# Patient Record
Sex: Male | Born: 1975 | Race: Black or African American | Hispanic: No | Marital: Married | Smoking: Never smoker
Health system: Southern US, Community
[De-identification: ages and names within clinical notes are randomized; demographics above are authoritative.]

## PROBLEM LIST (undated history)

## (undated) DIAGNOSIS — J45909 Unspecified asthma, uncomplicated: Secondary | ICD-10-CM

## (undated) DIAGNOSIS — K219 Gastro-esophageal reflux disease without esophagitis: Secondary | ICD-10-CM

## (undated) DIAGNOSIS — I10 Essential (primary) hypertension: Secondary | ICD-10-CM

## (undated) DIAGNOSIS — G4730 Sleep apnea, unspecified: Secondary | ICD-10-CM

## (undated) DIAGNOSIS — E119 Type 2 diabetes mellitus without complications: Secondary | ICD-10-CM

## (undated) HISTORY — DX: Type 2 diabetes mellitus without complications: E11.9

## (undated) HISTORY — PX: ADENOIDECTOMY: SUR15

## (undated) HISTORY — DX: Gastro-esophageal reflux disease without esophagitis: K21.9

## (undated) HISTORY — DX: Sleep apnea, unspecified: G47.30

---

## 2000-06-22 ENCOUNTER — Emergency Department (HOSPITAL_COMMUNITY): Admission: EM | Admit: 2000-06-22 | Discharge: 2000-06-22 | Payer: Self-pay | Admitting: Emergency Medicine

## 2000-06-22 ENCOUNTER — Encounter: Payer: Self-pay | Admitting: Emergency Medicine

## 2000-08-31 ENCOUNTER — Emergency Department (HOSPITAL_COMMUNITY): Admission: EM | Admit: 2000-08-31 | Discharge: 2000-08-31 | Payer: Self-pay | Admitting: Emergency Medicine

## 2000-09-01 ENCOUNTER — Emergency Department (HOSPITAL_COMMUNITY): Admission: EM | Admit: 2000-09-01 | Discharge: 2000-09-01 | Payer: Self-pay | Admitting: Emergency Medicine

## 2000-09-16 ENCOUNTER — Emergency Department (HOSPITAL_COMMUNITY): Admission: EM | Admit: 2000-09-16 | Discharge: 2000-09-16 | Payer: Self-pay | Admitting: Emergency Medicine

## 2001-02-10 ENCOUNTER — Emergency Department (HOSPITAL_COMMUNITY): Admission: EM | Admit: 2001-02-10 | Discharge: 2001-02-11 | Payer: Self-pay | Admitting: *Deleted

## 2011-02-07 ENCOUNTER — Emergency Department (HOSPITAL_BASED_OUTPATIENT_CLINIC_OR_DEPARTMENT_OTHER)
Admission: EM | Admit: 2011-02-07 | Discharge: 2011-02-07 | Disposition: A | Discharge status: Home / Self Care | Payer: Self-pay | Attending: Emergency Medicine | Admitting: Emergency Medicine

## 2011-02-07 DIAGNOSIS — I10 Essential (primary) hypertension: Secondary | ICD-10-CM | POA: Insufficient documentation

## 2011-02-07 DIAGNOSIS — G4730 Sleep apnea, unspecified: Secondary | ICD-10-CM | POA: Insufficient documentation

## 2011-02-07 DIAGNOSIS — J45909 Unspecified asthma, uncomplicated: Secondary | ICD-10-CM | POA: Insufficient documentation

## 2011-02-07 DIAGNOSIS — J329 Chronic sinusitis, unspecified: Secondary | ICD-10-CM | POA: Insufficient documentation

## 2015-05-29 ENCOUNTER — Emergency Department (HOSPITAL_BASED_OUTPATIENT_CLINIC_OR_DEPARTMENT_OTHER)
Admission: EM | Admit: 2015-05-29 | Discharge: 2015-05-29 | Disposition: A | Discharge status: Home / Self Care | Payer: Self-pay | Attending: Emergency Medicine | Admitting: Emergency Medicine

## 2015-05-29 ENCOUNTER — Encounter (HOSPITAL_BASED_OUTPATIENT_CLINIC_OR_DEPARTMENT_OTHER): Payer: Self-pay | Admitting: Emergency Medicine

## 2015-05-29 DIAGNOSIS — J45909 Unspecified asthma, uncomplicated: Secondary | ICD-10-CM | POA: Insufficient documentation

## 2015-05-29 DIAGNOSIS — R21 Rash and other nonspecific skin eruption: Secondary | ICD-10-CM | POA: Insufficient documentation

## 2015-05-29 DIAGNOSIS — I10 Essential (primary) hypertension: Secondary | ICD-10-CM | POA: Insufficient documentation

## 2015-05-29 HISTORY — DX: Unspecified asthma, uncomplicated: J45.909

## 2015-05-29 HISTORY — DX: Essential (primary) hypertension: I10

## 2015-05-29 MED ORDER — METHYLPREDNISOLONE 4 MG PO TBPK: ORAL_TABLET | ORAL | Status: DC

## 2015-05-29 MED ORDER — METHYLPREDNISOLONE SODIUM SUCC 125 MG IJ SOLR
125.0000 mg | Freq: Once | INTRAMUSCULAR | Status: AC
  Administered 2015-05-29: 125 mg via INTRAMUSCULAR
  Filled 2015-05-29: qty 2

## 2015-05-29 MED ORDER — HYDROXYZINE HCL 25 MG PO TABS
25.0000 mg | ORAL_TABLET | Freq: Once | ORAL | Status: AC
  Administered 2015-05-29: 25 mg via ORAL
  Filled 2015-05-29: qty 1

## 2015-05-29 MED ORDER — HYDROXYZINE HCL 25 MG PO TABS: 25.0000 mg | ORAL_TABLET | Freq: Four times a day (QID) | ORAL | Status: DC | PRN

## 2015-05-29 NOTE — ED Provider Notes (Signed)
CSN: [id number]     Arrival date & time 05/29/15  0027 History   First MD Initiated Contact with Patient 05/29/15 0138     Chief Complaint  Patient presents with  . Rash     (Consider location/radiation/quality/duration/timing/severity/associated sxs/prior Treatment) HPI  This is a 39 year old male with a rash that has been present for a week and a half. The onset was gradual. The rash began on his buttocks and is now generalized. It is characterized by a mixture of small vesicular lesions as well as thickened plaques. It is severely pruritic. He has not taken anything for his symptoms. He is not aware of being exposed to anything that may of caused a rash. He denies exposure to noise in his plans. He denies any respiratory changes. He denies any fever or other symptoms of a systemic illness. He denies nausea, vomiting and diarrhea. He has chronic patchy hyperpigmentation of his back and left upper abdomen as well as lower legs which has not acutely changed.  Past Medical History  Diagnosis Date  . Hypertension   . Asthma    Past Surgical History  Procedure Laterality Date  . Adenoidectomy     History reviewed. No pertinent family history. Social History  Substance Use Topics  . Smoking status: Never Smoker   . Smokeless tobacco: None  . Alcohol Use: No    Review of Systems  All other systems reviewed and are negative.   Allergies  Review of patient's allergies indicates no known allergies.  Home Medications   Prior to Admission medications   Not on File   BP 137/79 mmHg  Pulse 88  Temp(Src) 98.6 F (37 C) (Oral)  Resp 18  Ht  (1.905 m)  Wt 340 lb (154.223 kg)  BMI 42.50 kg/m2  SpO2 97%   Physical Exam  General: Well-developed, well-nourished male in no acute distress; appearance consistent with age of record HENT: normocephalic; atraumatic Eyes: pupils equal, round and reactive to light; extraocular muscles intact Neck: supple Heart: regular rate and  rhythm Lungs: clear to auscultation bilaterally Abdomen: soft; nondistended; nontender; reducible umbilical hernia Extremities: No deformity; full range of motion; pulses normal Neurologic: Awake, alert and oriented; motor function intact in all extremities and symmetric; no facial droop Skin: Warm and dry; generalized rash with both vesicular and plaque-like components:  Right Buttock   Chest and Abdomen (large hyperpigmented lesion is not acute):    Face and Neck:   Psychiatric: Normal mood and affect    ED Course  Procedures (including critical care time)   MDM  We'll treat with cortical steroids and anti-histamine and refer to dermatology.   Paula Libra, MD 05/29/15 [phone]

## 2015-05-29 NOTE — ED Notes (Signed)
Rash on body and arms x 1 week  Increased itching  Denies pain

## 2015-05-29 NOTE — ED Notes (Signed)
Pt in c/o blistering rash to trunk and extremities x 1 week. Denies working outside around plants.

## 2015-07-19 ENCOUNTER — Encounter (HOSPITAL_COMMUNITY): Payer: Self-pay | Admitting: Emergency Medicine

## 2015-07-19 ENCOUNTER — Emergency Department (HOSPITAL_COMMUNITY): Payer: Self-pay

## 2015-07-19 ENCOUNTER — Emergency Department (HOSPITAL_COMMUNITY)
Admission: EM | Admit: 2015-07-19 | Discharge: 2015-07-19 | Disposition: A | Discharge status: Home / Self Care | Payer: Self-pay | Attending: Emergency Medicine | Admitting: Emergency Medicine

## 2015-07-19 DIAGNOSIS — R079 Chest pain, unspecified: Secondary | ICD-10-CM | POA: Insufficient documentation

## 2015-07-19 DIAGNOSIS — I10 Essential (primary) hypertension: Secondary | ICD-10-CM | POA: Insufficient documentation

## 2015-07-19 DIAGNOSIS — J45901 Unspecified asthma with (acute) exacerbation: Secondary | ICD-10-CM | POA: Insufficient documentation

## 2015-07-19 LAB — I-STAT TROPONIN, ED
TROPONIN I, POC: 0 ng/mL (ref 0.00–0.08)
Troponin i, poc: 0 ng/mL (ref 0.00–0.08)

## 2015-07-19 LAB — BASIC METABOLIC PANEL
Anion gap: 5 (ref 5–15)
BUN: 8 mg/dL (ref 6–20)
CALCIUM: 8.7 mg/dL — AB (ref 8.9–10.3)
CO2: 32 mmol/L (ref 22–32)
CREATININE: 1.06 mg/dL (ref 0.61–1.24)
Chloride: 102 mmol/L (ref 101–111)
GFR calc Af Amer: >60 mL/min (ref >60)
GFR calc non Af Amer: >60 mL/min (ref >60)
GLUCOSE: 109 mg/dL — AB (ref 65–99)
Potassium: 3.5 mmol/L (ref 3.5–5.1)
Sodium: 139 mmol/L (ref 135–145)

## 2015-07-19 LAB — CBC
HCT: 43.2 % (ref 39.0–52.0)
HEMOGLOBIN: 14.2 g/dL (ref 13.0–17.0)
MCH: 31.1 pg (ref 26.0–34.0)
MCHC: 32.9 g/dL (ref 30.0–36.0)
MCV: 94.7 fL (ref 78.0–100.0)
PLATELETS: 175 10*3/uL (ref 150–400)
RBC: 4.56 MIL/uL (ref 4.22–5.81)
RDW: 13.8 % (ref 11.5–15.5)
WBC: 10.8 10*3/uL — ABNORMAL HIGH (ref 4.0–10.5)

## 2015-07-19 MED ORDER — IBUPROFEN 800 MG PO TABS: 800.0000 mg | ORAL_TABLET | Freq: Three times a day (TID) | ORAL | Status: DC

## 2015-07-19 MED ORDER — CLOTRIMAZOLE 1 % EX CREA: TOPICAL_CREAM | CUTANEOUS | Status: DC

## 2015-07-19 MED ORDER — GI COCKTAIL ~~LOC~~
30.0000 mL | Freq: Once | ORAL | Status: AC
Start: 2015-07-19 — End: 2015-07-19
  Administered 2015-07-19: 30 mL via ORAL
  Filled 2015-07-19: qty 30

## 2015-07-19 MED ORDER — PANTOPRAZOLE SODIUM 20 MG PO TBEC: 20.0000 mg | DELAYED_RELEASE_TABLET | Freq: Every day | ORAL | Status: DC

## 2015-07-19 MED ORDER — IOHEXOL 350 MG/ML SOLN
100.0000 mL | Freq: Once | INTRAVENOUS | Status: AC | PRN
  Administered 2015-07-19: 100 mL via INTRAVENOUS

## 2015-07-19 MED ORDER — CLOTRIMAZOLE 1 % EX CREA
TOPICAL_CREAM | Freq: Two times a day (BID) | CUTANEOUS | Status: DC
  Administered 2015-07-19: 20:00:00 via TOPICAL
  Filled 2015-07-19: qty 15

## 2015-07-19 NOTE — Discharge Instructions (Signed)
Take protonix daily for indigestion. Use lotrimin as directed until symptoms resolve. Refer to attached documents for more information. Return to the ED with worsening or concerning symptoms.  Chest Wall Pain Chest wall pain is pain in or around the bones and muscles of your chest. Sometimes, an injury causes this pain. Sometimes, the cause may not be known. This pain may take several weeks or longer to get better. HOME CARE INSTRUCTIONS  Pay attention to any changes in your symptoms. Take these actions to help with your pain:   Rest as told by your health care provider.   Avoid activities that cause pain. These include any activities that use your chest muscles or your abdominal and side muscles to lift heavy items.   If directed, apply ice to the painful area:  Put ice in a plastic bag.  Place a towel between your skin and the bag.  Leave the ice on for 20 minutes, 2-3 times per day.  Take over-the-counter and prescription medicines only as told by your health care provider.  Do not use tobacco products, including cigarettes, chewing tobacco, and e-cigarettes. If you need help quitting, ask your health care provider.  Keep all follow-up visits as told by your health care provider. This is important. SEEK MEDICAL CARE IF:  You have a fever.  Your chest pain becomes worse.  You have new symptoms. SEEK IMMEDIATE MEDICAL CARE IF:  You have nausea or vomiting.  You feel sweaty or light-headed.  You have a cough with phlegm (sputum) or you cough up blood.  You develop shortness of breath.   This information is not intended to replace advice given to you by your health care provider. Make sure you discuss any questions you have with your health care provider.   Document Released: 09/29/2005 Document Revised: 06/20/2015 Document Reviewed: 12/25/2014 Elsevier Interactive Patient Education [employer]

## 2015-07-19 NOTE — Progress Notes (Signed)
Buddy Duty [facility] Eligibility [facility] [phone]  Spoke to patient regarding primary care resources and the [facility] orange card. Patient sts he is currently on workers comp for current medical issues. Orange card application provided and explained, resource guide and my contact information also provided for any future questions or concerns. No other [facility] needs identified at this time.

## 2015-07-19 NOTE — ED Notes (Signed)
Pt came in per EMS c/o chest pain that started at work this morning. 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 Pt denies weakness, N/V/. Pt says that pain lasted for 45 minutes to an hour and then went away. Pt denies chest pain at this time or SOB.

## 2015-07-19 NOTE — ED Notes (Signed)
Pt placed in a gown and hooked up to the monitor with a 5 lead, BP cuff and pulse ox 

## 2015-07-19 NOTE — ED Provider Notes (Signed)
CSN: [id number]     Arrival date & time 07/19/15  1231 History   First MD Initiated Contact with Patient 07/19/15 1249     Chief Complaint  Patient presents with  . Chest Pain     (Consider location/radiation/quality/duration/timing/severity/associated sxs/prior Treatment) HPI Comments: Patient is a 39 year old male with a past medical history of hypertension and asthma who presents with chest pain that started at 9:30am today while he was at work. 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 He reports the severe pain lasted about 3 minutes and digressed to moderate pain and fully subsided after 30 minutes. He reports associated SOB. No aggravating/alleviating factors. Patient reports feeling like this previously and reports "it was just gas".   Past Medical History  Diagnosis Date  . Hypertension   . Asthma    Past Surgical History  Procedure Laterality Date  . Adenoidectomy     History reviewed. No pertinent family history. Social History  Substance Use Topics  . Smoking status: Never Smoker   . Smokeless tobacco: None  . Alcohol Use: No    Review of Systems  Respiratory: Positive for shortness of breath.   Cardiovascular: Positive for chest pain.  All other systems reviewed and are negative.     Allergies  Review of patient's allergies indicates no known allergies.  Home Medications   Prior to Admission medications   Medication Sig Start Date End Date Taking? Authorizing Provider  hydrOXYzine (ATARAX/VISTARIL) 25 MG tablet Take 1-2 tablets (25-50 mg total) by mouth every 6 (six) hours as needed for itching (may cause drowsiness). 05/29/15   John Molpus, MD  methylPREDNISolone (MEDROL DOSEPAK) 4 MG TBPK tablet Take tapering course per package instructions. 05/29/15   John Molpus, MD   BP 111/56 mmHg  Pulse 78  Temp(Src) 97.5 F (36.4 C) (Oral)  Resp 13  SpO2  96% Physical Exam  Constitutional: He is oriented to person, place, and time. He appears well-developed and well-nourished. No distress.  HENT:  Head: Normocephalic and atraumatic.  Eyes: Conjunctivae and EOM are normal.  Neck: Normal range of motion.  Cardiovascular: Normal rate and regular rhythm.  Exam reveals no gallop and no friction rub.   No murmur heard. Pulmonary/Chest: Effort normal and breath sounds normal. He has no wheezes. He has no rales. He exhibits no tenderness.  Abdominal: Soft. He exhibits no distension. There is no tenderness. There is no rebound.  Musculoskeletal: Normal range of motion.  Neurological: He is alert and oriented to person, place, and time. Coordination normal.  Speech is goal-oriented. Moves limbs without ataxia.   Skin: Skin is warm and dry.  Psychiatric: He has a normal mood and affect. His behavior is normal.  Nursing note and vitals reviewed.   ED Course  Procedures (including critical care time) Labs Review Labs Reviewed  BASIC METABOLIC PANEL - Abnormal; Notable for the following:    Glucose, Bld 109 (*)    Calcium 8.7 (*)    All other components within normal limits  CBC - Abnormal; Notable for the following:    WBC 10.8 (*)    All other components within normal limits  I-STAT TROPOININ, ED  Rosezena Sensor, ED    Imaging Review Dg Chest 2 View  07/19/2015   CLINICAL DATA:  Midchest pain.  EXAM: CHEST  2 VIEW  COMPARISON:  None.  FINDINGS: The  heart size and mediastinal contours are within normal limits. Both lungs are clear. The visualized skeletal structures are unremarkable.  IMPRESSION: No active cardiopulmonary disease.   Electronically Signed   By: Elige Ko   On: 07/19/2015 13:25   I have personally reviewed and evaluated these images and lab results as part of my medical decision-making.   EKG Interpretation   Date/Time:  Thursday July 19 2015 12:41:21 EDT Ventricular Rate:  89 PR Interval:  164 QRS Duration:  106 QT Interval:  351 QTC Calculation: 427 R Axis:   33 Text Interpretation:  Sinus rhythm ST elevation suggests acute  pericarditis Confirmed by ZACKOWSKI  MD, SCOTT ([id number]) on 07/19/2015  12:45:41 PM      MDM   Final diagnoses:  Chest pain    1:12 PM Patient's labs and chest xray pending. Vitals stable and patient afebrile.   2:11 PM Patient's HEART score 3. Patient will have delta troponin here and be discharged if normal. Patient will have GI cocktail here.   Patient signed out to Dr. Gwendolyn Grant.   Emilia Beck, PA-C 07/22/15 1610  Vanetta Mulders, MD 07/25/15 [phone]

## 2015-07-19 NOTE — ED Notes (Signed)
Pt requested sprite, graham crackers and peanut butter pt given the same ok per RN Denny Peon

## 2016-04-02 IMAGING — DX DG CHEST 2V
2 series · 2 of 2 positions shown · non-contrast
Comparison: None.

CLINICAL DATA: Midchest pain.

EXAM:
CHEST  2 VIEW

[w chest pa]
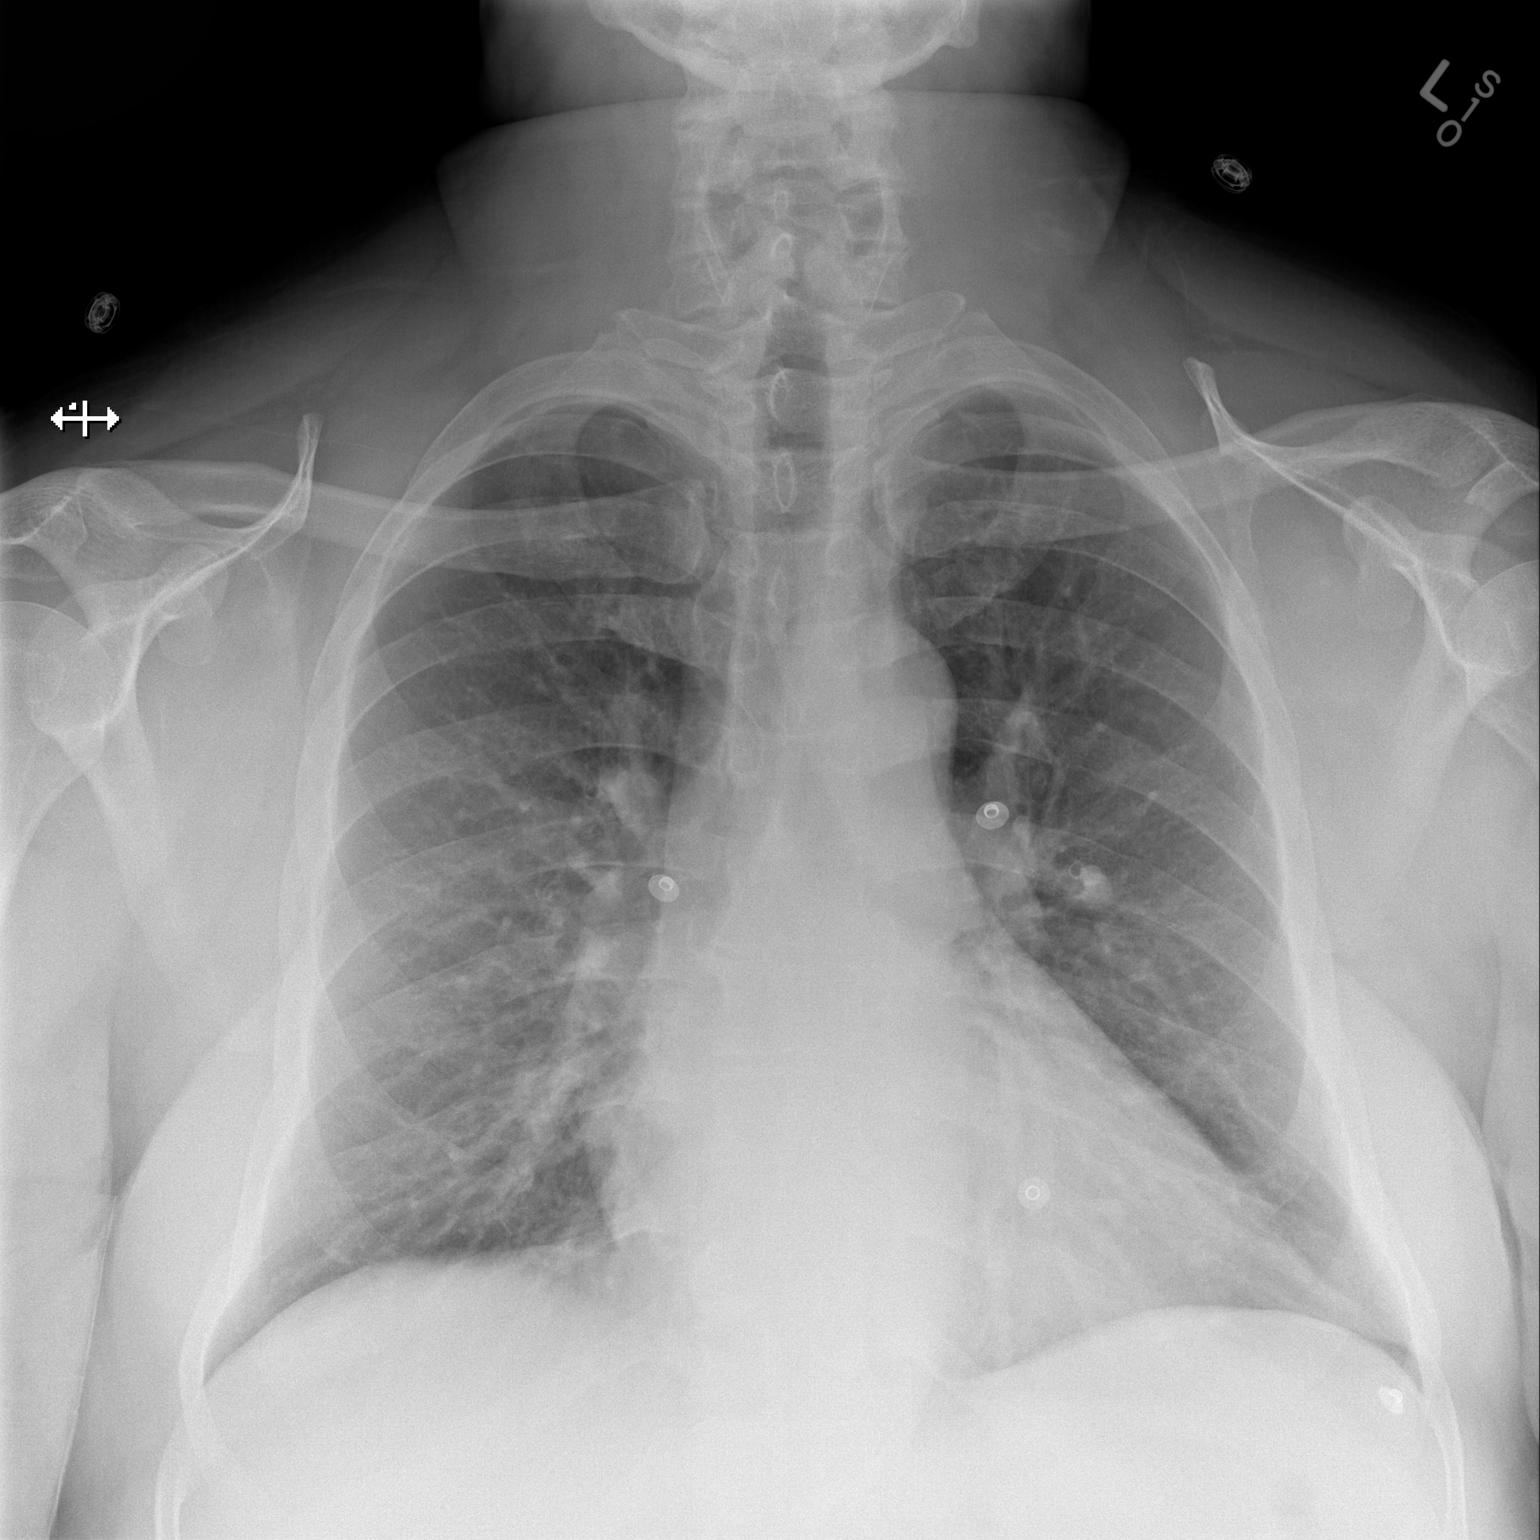

[w chest lat]
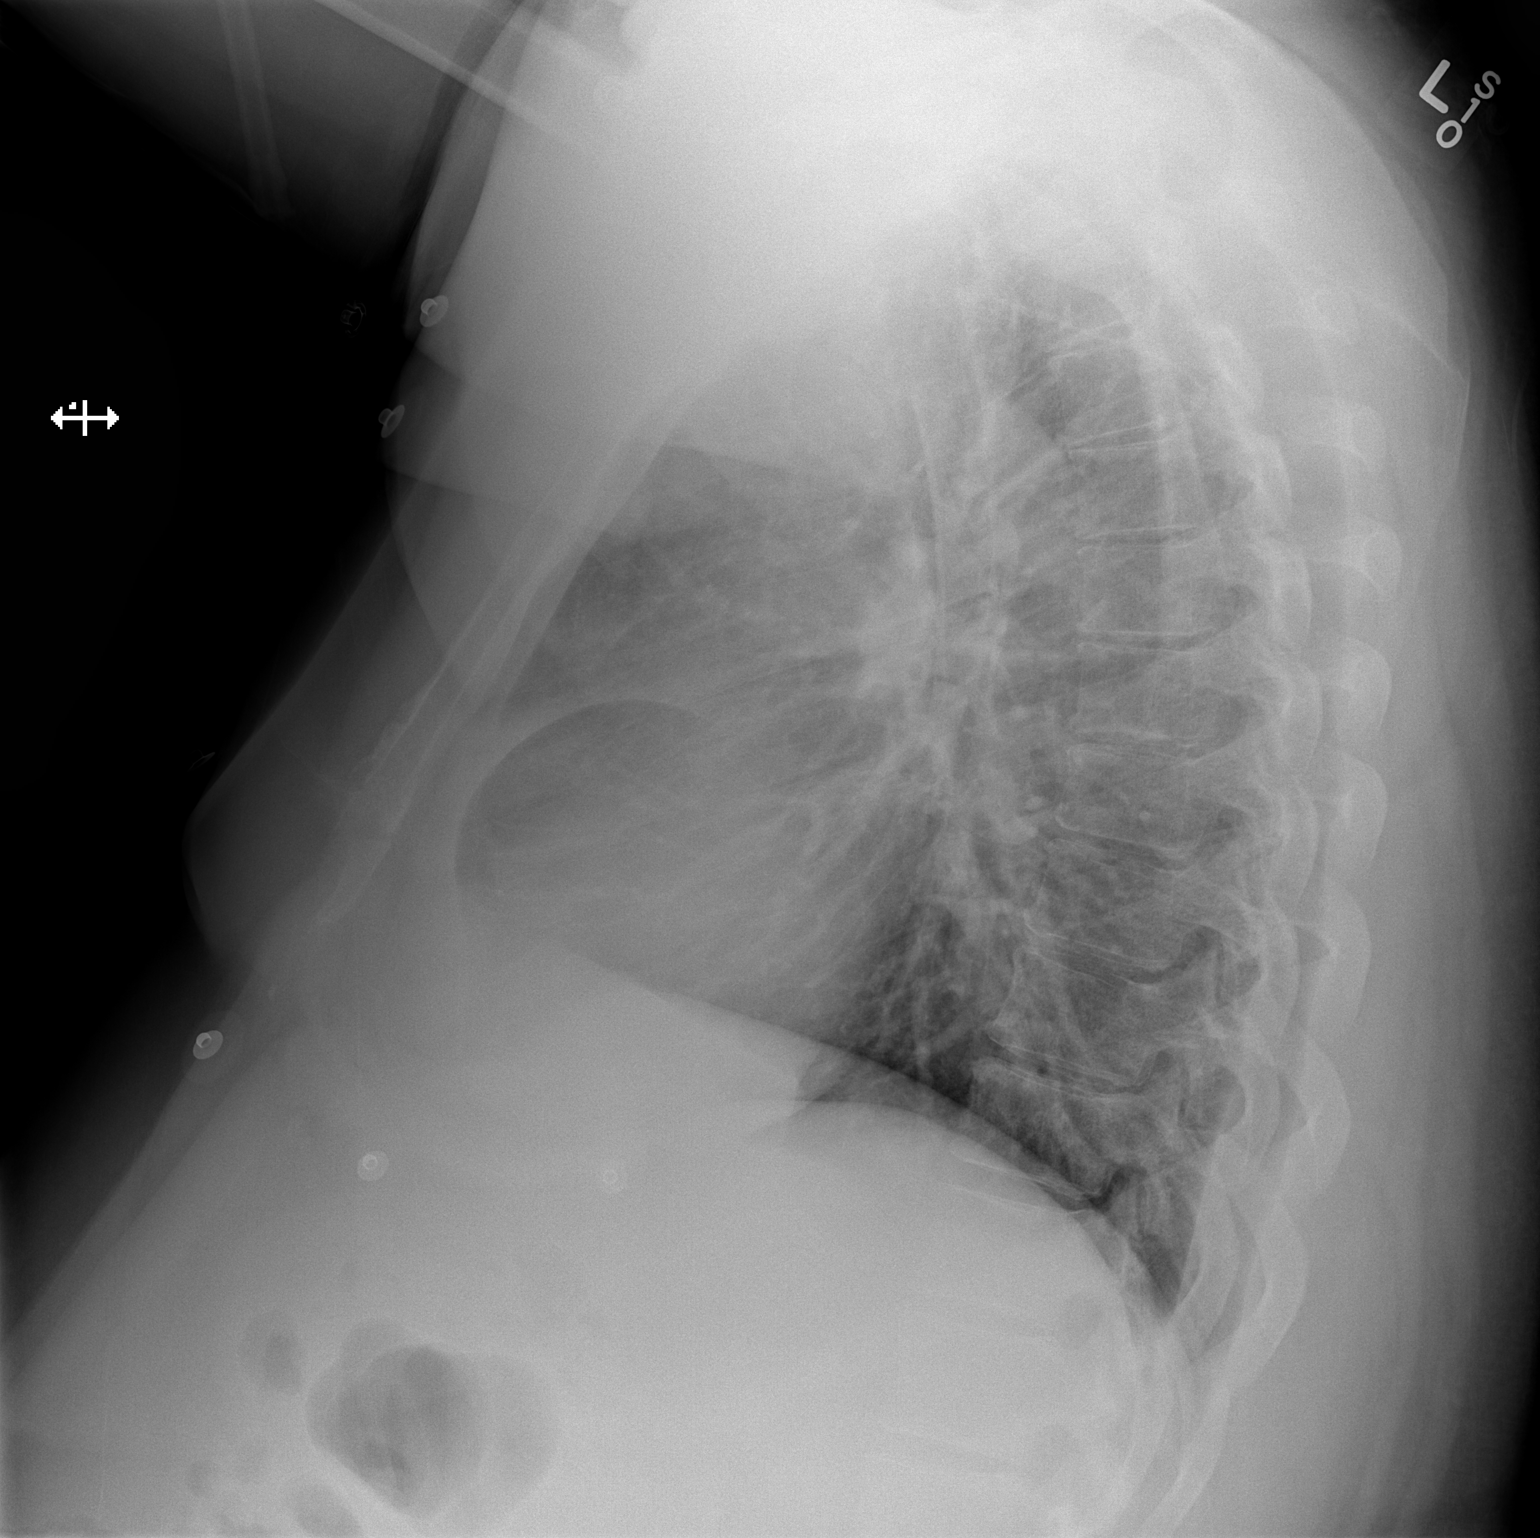

[2 of 2 positions shown; findings below may reference images not displayed]

FINDINGS: The heart size and mediastinal contours are within normal limits.
Both lungs are clear. The visualized skeletal structures are
unremarkable.
IMPRESSION: No active cardiopulmonary disease.

## 2017-11-09 ENCOUNTER — Other Ambulatory Visit: Payer: Self-pay

## 2017-11-09 ENCOUNTER — Encounter (HOSPITAL_COMMUNITY): Payer: Self-pay | Admitting: Emergency Medicine

## 2017-11-09 ENCOUNTER — Emergency Department (HOSPITAL_COMMUNITY): Payer: Self-pay

## 2017-11-09 DIAGNOSIS — J45909 Unspecified asthma, uncomplicated: Secondary | ICD-10-CM | POA: Insufficient documentation

## 2017-11-09 DIAGNOSIS — I10 Essential (primary) hypertension: Secondary | ICD-10-CM | POA: Insufficient documentation

## 2017-11-09 DIAGNOSIS — Z79899 Other long term (current) drug therapy: Secondary | ICD-10-CM | POA: Insufficient documentation

## 2017-11-09 DIAGNOSIS — K219 Gastro-esophageal reflux disease without esophagitis: Secondary | ICD-10-CM | POA: Insufficient documentation

## 2017-11-09 LAB — BASIC METABOLIC PANEL
Anion gap: 11 (ref 5–15)
BUN: 12 mg/dL (ref 6–20)
CHLORIDE: 103 mmol/L (ref 101–111)
CO2: 27 mmol/L (ref 22–32)
CREATININE: 1.06 mg/dL (ref 0.61–1.24)
Calcium: 9.1 mg/dL (ref 8.9–10.3)
GFR calc non Af Amer: >60 mL/min (ref >60)
Glucose, Bld: 144 mg/dL — ABNORMAL HIGH (ref 65–99)
Potassium: 3.6 mmol/L (ref 3.5–5.1)
Sodium: 141 mmol/L (ref 135–145)

## 2017-11-09 LAB — CBC
HCT: 45.2 % (ref 39.0–52.0)
Hemoglobin: 15.1 g/dL (ref 13.0–17.0)
MCH: 31.3 pg (ref 26.0–34.0)
MCHC: 33.4 g/dL (ref 30.0–36.0)
MCV: 93.8 fL (ref 78.0–100.0)
PLATELETS: 187 10*3/uL (ref 150–400)
RBC: 4.82 MIL/uL (ref 4.22–5.81)
RDW: 13.3 % (ref 11.5–15.5)
WBC: 10.6 10*3/uL — AB (ref 4.0–10.5)

## 2017-11-09 LAB — I-STAT TROPONIN, ED: TROPONIN I, POC: 0 ng/mL (ref 0.00–0.08)

## 2017-11-09 NOTE — ED Triage Notes (Signed)
Pt c/o intermittent chest pain, left arm tingling that started today. Pt reports that he has been belching a lot tonight as well. Denies shortness of breath/nausea/vomiting. Denies chest pain at this time.

## 2017-11-10 ENCOUNTER — Emergency Department (HOSPITAL_COMMUNITY)
Admission: EM | Admit: 2017-11-10 | Discharge: 2017-11-10 | Disposition: A | Discharge status: Home / Self Care | Payer: Self-pay | Attending: Emergency Medicine | Admitting: Emergency Medicine

## 2017-11-10 ENCOUNTER — Other Ambulatory Visit: Payer: Self-pay

## 2017-11-10 DIAGNOSIS — R0789 Other chest pain: Secondary | ICD-10-CM

## 2017-11-10 DIAGNOSIS — K219 Gastro-esophageal reflux disease without esophagitis: Secondary | ICD-10-CM

## 2017-11-10 LAB — I-STAT TROPONIN, ED: Troponin i, poc: 0.02 ng/mL (ref 0.00–0.08)

## 2017-11-10 MED ORDER — GI COCKTAIL ~~LOC~~
30.0000 mL | Freq: Once | ORAL | Status: AC
  Administered 2017-11-10: 30 mL via ORAL
  Filled 2017-11-10: qty 30

## 2017-11-10 MED ORDER — PANTOPRAZOLE SODIUM 40 MG PO TBEC: 40.0000 mg | DELAYED_RELEASE_TABLET | Freq: Every day | ORAL | 0 refills | Status: DC

## 2017-11-10 NOTE — ED Notes (Signed)
ED Provider at bedside. 

## 2017-11-10 NOTE — Discharge Instructions (Addendum)
You may use over-the-counter Maalox which will help with heartburn, indigestion and gas.   Please avoid NSAIDs such as aspirin (Goody powders), ibuprofen (Motrin, Advil), naproxen (Aleve) as these may worsen your symptoms.  Tylenol 1000 mg every 6 hours is safe to take as long as you have no history of liver problems (heavy alcohol use, cirrhosis, hepatitis).  Please avoid spicy, acidic (citrus fruits, tomato based sauces, salsa), greasy, fatty foods.  Please avoid caffeine and alcohol.      To find a primary care or specialty doctor please call [phone] or [phone] to access "[place] Find a Doctor Service."  You may also go on the [facility] website at [email or web address]  There are also multiple [facility], [facility], [facility] and [facility] practices throughout the [place] that are frequently accepting new patients. You may find a clinic that is close to your home and contact them.  [facility] -  [address] [place] [id number] [phone]   [facility] -  [address] [place] [id number] [phone]   [facility] [phone]- [phone]  [place] [phone]

## 2017-11-10 NOTE — ED Provider Notes (Signed)
TIME SEEN: 5:44 AM  CHIEF COMPLAINT: Chest pain  HPI: Patient is a 42 year old male with history of obesity, hypertension, diabetes who presents emergency department with chest pain.  Chest pain started around 9 PM 1 hour after eating a cheeseburger macaroni.  Describes the pain as a sharp, pressure-like pain that is better after belching.  Has had similar symptoms with gas in the past and reports a GI cocktail has helped him significantly.  No significant pain currently.  No shortness of breath, nausea, vomiting, diaphoresis or dizziness.  No fever or cough.  No lower extremity swelling or pain.  Pain is not pleuritic or exertional.  ROS: See HPI Constitutional: no fever  Eyes: no drainage  ENT: no runny nose   Cardiovascular:   chest pain  Resp: no SOB  GI: no vomiting GU: no dysuria Integumentary: no rash  Allergy: no hives  Musculoskeletal: no leg swelling  Neurological: no slurred speech ROS otherwise negative  PAST MEDICAL HISTORY/PAST SURGICAL HISTORY:  Past Medical History:  Diagnosis Date  . Asthma   . Hypertension     MEDICATIONS:  Prior to Admission medications   Medication Sig Start Date End Date Taking? Authorizing Provider  Armodafinil 250 MG tablet Take 250 mg by mouth. 03/14/15   [provider]  clotrimazole (LOTRIMIN) 1 % cream Apply to affected area daily 07/19/15   Emilia Beck, PA-C  hydrochlorothiazide (HYDRODIURIL) 25 MG tablet Take 25 mg by mouth daily.    [provider]  ibuprofen (ADVIL,MOTRIN) 800 MG tablet Take 1 tablet (800 mg total) by mouth 3 (three) times daily. 07/19/15   Elwin Mocha, MD  pantoprazole (PROTONIX) 20 MG tablet Take 1 tablet (20 mg total) by mouth daily. 07/19/15   Emilia Beck, PA-C    ALLERGIES:  No Known Allergies  SOCIAL HISTORY:  Social History   Tobacco Use  . Smoking status: Never Smoker  . Smokeless tobacco: Never Used  Substance Use Topics  . Alcohol use: No    FAMILY HISTORY: No  family history on file.  EXAM: BP 117/70   Pulse 88   Temp 99 F (37.2 C) (Oral)   Resp 16   Ht 6\' 3"  (1.905 m)   SpO2 96%   BMI 42.50 kg/m  CONSTITUTIONAL: Alert and oriented and responds appropriately to questions. Well-appearing; well-nourished.  Obese.  No distress.  HEAD: Normocephalic EYES: Conjunctivae clear, pupils appear equal, EOMI ENT: normal nose; moist mucous membranes NECK: Supple, no meningismus, no nuchal rigidity, no LAD  CARD: RRR; S1 and S2 appreciated; no murmurs, no clicks, no rubs, no gallops CHEST:  Chest wall is tender to palpation over the center of the anterior chest wall which reproduces some of his pain.  No crepitus, ecchymosis, erythema, warmth, rash or other lesions present.   RESP: Normal chest excursion without splinting or tachypnea; breath sounds clear and equal bilaterally; no wheezes, no rhonchi, no rales, no hypoxia or respiratory distress, speaking full sentences ABD/GI: Normal bowel sounds; non-distended; soft, non-tender, no rebound, no guarding, no peritoneal signs, no hepatosplenomegaly BACK:  The back appears normal and is non-tender to palpation, there is no CVA tenderness EXT: Normal ROM in all joints; non-tender to palpation; no edema; normal capillary refill; no cyanosis, no calf tenderness or swelling    SKIN: Normal color for age and race; warm; no rash NEURO: Moves all extremities equally PSYCH: The patient's mood and manner are appropriate. Grooming and personal hygiene are appropriate.  MEDICAL DECISION MAKING: Patient here with atypical  chest pain.  First troponin negative.  He does have some risk factors for ACS therefore will obtain second troponin suspect this is GI in nature versus musculoskeletal pain.  Doubt PE or dissection.  Labs otherwise unremarkable.  Abdominal exam benign.  Chest x-ray clear.  Will give GI cocktail given patient reports significant help with similar symptoms in the past with this medication.  ED PROGRESS:  Patient reports significant relief after GI cocktail.  Second troponin negative.  I feel he is safe to be discharged and follow-up with primary care as an outpatient.  Will discharge with prescription of Protonix.  Recommended over-the-counter Maalox as needed.  Discussed return precautions.  Recommended diet changes.  Patient and wife comfortable with this plan.  At this time, I do not feel there is any life-threatening condition present. I have reviewed and discussed all results (EKG, imaging, lab, urine as appropriate) and exam findings with patient/family. I have reviewed nursing notes and appropriate previous records.  I feel the patient is safe to be discharged home without further emergent workup and can continue workup as an outpatient as needed. Discussed usual and customary return precautions. Patient/family verbalize understanding and are comfortable with this plan.  Outpatient follow-up has been provided if needed. All questions have been answered.      EKG Interpretation  Date/Time:  Monday November 09 2017 23:13:42 EST Ventricular Rate:  95 PR Interval:  168 QRS Duration: 96 QT Interval:  350 QTC Calculation: 439 R Axis:   30 Text Interpretation:  Normal sinus rhythm Normal ECG No significant change since last tracing in 2002 Confirmed by Oneta Sigman, Baxter HireKristen [id number]) on 11/10/2017 5:44:30 AM         [name], [name], DO 11/10/17 [id number]

## 2019-07-05 ENCOUNTER — Other Ambulatory Visit: Payer: Self-pay

## 2019-07-05 ENCOUNTER — Emergency Department (HOSPITAL_BASED_OUTPATIENT_CLINIC_OR_DEPARTMENT_OTHER): Payer: Self-pay

## 2019-07-05 ENCOUNTER — Encounter (HOSPITAL_BASED_OUTPATIENT_CLINIC_OR_DEPARTMENT_OTHER): Payer: Self-pay | Admitting: *Deleted

## 2019-07-05 ENCOUNTER — Emergency Department (HOSPITAL_BASED_OUTPATIENT_CLINIC_OR_DEPARTMENT_OTHER)
Admission: EM | Admit: 2019-07-05 | Discharge: 2019-07-06 | Disposition: A | Discharge status: Home / Self Care | Payer: Self-pay | Attending: Emergency Medicine | Admitting: Emergency Medicine

## 2019-07-05 DIAGNOSIS — I10 Essential (primary) hypertension: Secondary | ICD-10-CM | POA: Insufficient documentation

## 2019-07-05 DIAGNOSIS — Z79899 Other long term (current) drug therapy: Secondary | ICD-10-CM | POA: Insufficient documentation

## 2019-07-05 DIAGNOSIS — J45909 Unspecified asthma, uncomplicated: Secondary | ICD-10-CM | POA: Insufficient documentation

## 2019-07-05 DIAGNOSIS — R072 Precordial pain: Secondary | ICD-10-CM | POA: Insufficient documentation

## 2019-07-05 DIAGNOSIS — Z7984 Long term (current) use of oral hypoglycemic drugs: Secondary | ICD-10-CM | POA: Insufficient documentation

## 2019-07-05 LAB — CBC
HCT: 48 % (ref 39.0–52.0)
Hemoglobin: 15.1 g/dL (ref 13.0–17.0)
MCH: 30.6 pg (ref 26.0–34.0)
MCHC: 31.5 g/dL (ref 30.0–36.0)
MCV: 97.4 fL (ref 80.0–100.0)
Platelets: 208 10*3/uL (ref 150–400)
RBC: 4.93 MIL/uL (ref 4.22–5.81)
RDW: 13.2 % (ref 11.5–15.5)
WBC: 11 10*3/uL — ABNORMAL HIGH (ref 4.0–10.5)
nRBC: 0 % (ref 0.0–0.2)

## 2019-07-05 LAB — URINALYSIS, ROUTINE W REFLEX MICROSCOPIC
Bilirubin Urine: NEGATIVE
Glucose, UA: NEGATIVE mg/dL
Hgb urine dipstick: NEGATIVE
Ketones, ur: NEGATIVE mg/dL
Leukocytes,Ua: NEGATIVE
Nitrite: NEGATIVE
Protein, ur: NEGATIVE mg/dL
Specific Gravity, Urine: 1.025 (ref 1.005–1.030)
pH: 6 (ref 5.0–8.0)

## 2019-07-05 LAB — TROPONIN I (HIGH SENSITIVITY): Troponin I (High Sensitivity): 5 ng/L (ref <18)

## 2019-07-05 LAB — BASIC METABOLIC PANEL
Anion gap: 10 (ref 5–15)
BUN: 11 mg/dL (ref 6–20)
CO2: 28 mmol/L (ref 22–32)
Calcium: 8.8 mg/dL — ABNORMAL LOW (ref 8.9–10.3)
Chloride: 100 mmol/L (ref 98–111)
Creatinine, Ser: 0.92 mg/dL (ref 0.61–1.24)
GFR calc Af Amer: >60 mL/min (ref >60)
GFR calc non Af Amer: >60 mL/min (ref >60)
Glucose, Bld: 149 mg/dL — ABNORMAL HIGH (ref 70–99)
Potassium: 3.6 mmol/L (ref 3.5–5.1)
Sodium: 138 mmol/L (ref 135–145)

## 2019-07-05 MED ORDER — PANTOPRAZOLE SODIUM 40 MG IV SOLR
40.0000 mg | Freq: Once | INTRAVENOUS | Status: AC
  Administered 2019-07-05: 40 mg via INTRAVENOUS
  Filled 2019-07-05: qty 40

## 2019-07-05 MED ORDER — ASPIRIN 81 MG PO CHEW
324.0000 mg | CHEWABLE_TABLET | Freq: Once | ORAL | Status: AC
  Administered 2019-07-06: 324 mg via ORAL
  Filled 2019-07-05: qty 4

## 2019-07-05 MED ORDER — SODIUM CHLORIDE 0.9% FLUSH
3.0000 mL | Freq: Once | INTRAVENOUS | Status: DC
  Filled 2019-07-05: qty 3

## 2019-07-05 NOTE — ED Notes (Signed)
EDP Dr. Earnest Conroy very explanitory about reason to be admitted with chest Pt. Complaints.

## 2019-07-05 NOTE — ED Provider Notes (Addendum)
[facility] EMERGENCY DEPARTMENT Provider Note   CSN: [id number] Arrival date & time: 07/05/19  2100     History   Chief Complaint Chief Complaint  Patient presents with  . Chest Pain    HPI Jared Hudson is a 43 y.o. male.  HPI: A 43 year old patient with a history of treated diabetes, hypertension and obesity presents for evaluation of chest pain. Initial onset of pain was approximately 1-3 hours ago. The patient's chest pain is sharp and is not worse with exertion. The patient's chest pain is middle- or left-sided, is not well-localized, is not described as heaviness/pressure/tightness and does not radiate to the arms/jaw/neck. The patient does not complain of nausea and denies diaphoresis. The patient has no history of stroke, has no history of peripheral artery disease, has not smoked in the past 90 days, has no relevant family history of coronary artery disease (first degree relative at less than age 43) and has no history of hypercholesterolemia.   Patient with the onset of lots of belching and discomfort substernal area at approximately 2030 tonight.  Turned into more of a discomfort with sharp pains coming and going at around 2100.  Patient's had some longstanding lower extremity edema.  Patient's been here erratic with taking his blood pressure medicine.  He got mostly concern for coming in because of the blood pressure being 200/112 at home.  He did take his blood pressure medicine which is hydrochlorothiazide before he came in and his blood pressure currently is now 153/94.  Patient states he still having some belching.  And is having a heaviness in the in the chest.  But it comes and goes.  In the past occasionally will get some discomfort in his left arm but he is not having any that today.  No known cardiac history but he has a history of hypertension and diabetes.  Patient is never been a smoker.  And does not have a family history of premature cardiac disease.      Past Medical History:  Diagnosis Date  . Asthma   . Hypertension     There are no active problems to display for this patient.   Past Surgical History:  Procedure Laterality Date  . ADENOIDECTOMY    . TONSILLECTOMY          Home Medications    Prior to Admission medications   Medication Sig Start Date End Date Taking? Authorizing Provider  hydrochlorothiazide (HYDRODIURIL) 25 MG tablet Take 25 mg by mouth daily.   Yes [provider]  metFORMIN (GLUCOPHAGE) 500 MG tablet Take 500 mg by mouth 2 (two) times daily. 01/29/17  Yes [provider]  pantoprazole (PROTONIX) 40 MG tablet Take 1 tablet (40 mg total) by mouth daily. 11/10/17  Yes Ward, Layla Maw, DO    Family History No family history on file.  Social History Social History   Tobacco Use  . Smoking status: Never Smoker  . Smokeless tobacco: Never Used  Substance Use Topics  . Alcohol use: No  . Drug use: No     Allergies   Patient has no known allergies.   Review of Systems Review of Systems  Constitutional: Negative for chills and fever.  HENT: Negative for congestion, rhinorrhea and sore throat.   Eyes: Negative for visual disturbance.  Respiratory: Negative for cough and shortness of breath.   Cardiovascular: Positive for chest pain and leg swelling.  Gastrointestinal: Negative for abdominal pain, diarrhea, nausea and vomiting.  Genitourinary: Negative for  dysuria.  Musculoskeletal: Negative for back pain and neck pain.  Skin: Negative for rash.  Neurological: Negative for dizziness, light-headedness and headaches.  Hematological: Does not bruise/bleed easily.  Psychiatric/Behavioral: Negative for confusion.     Physical Exam Updated Vital Signs BP (!) 153/94   Pulse 86   Temp 99 F (37.2 C) (Oral)   Resp 20   Ht 1.829 m (6')   Wt (!) 152 kg   SpO2 96%   BMI 45.43 kg/m   Physical Exam Vitals signs and nursing note reviewed.  Constitutional:      Appearance:  Normal appearance. He is well-developed.  HENT:     Head: Normocephalic and atraumatic.  Eyes:     Extraocular Movements: Extraocular movements intact.     Conjunctiva/sclera: Conjunctivae normal.     Pupils: Pupils are equal, round, and reactive to light.  Neck:     Musculoskeletal: Normal range of motion and neck supple.  Cardiovascular:     Rate and Rhythm: Normal rate and regular rhythm.     Heart sounds: No murmur.  Pulmonary:     Effort: Pulmonary effort is normal. No respiratory distress.     Breath sounds: Normal breath sounds.  Abdominal:     Palpations: Abdomen is soft.     Tenderness: There is no abdominal tenderness.  Musculoskeletal:        General: Swelling present.     Right lower leg: Edema present.     Left lower leg: Edema present.     Comments: 1+ pitting edema.  Skin:    General: Skin is warm and dry.  Neurological:     General: No focal deficit present.     Mental Status: He is alert and oriented to person, place, and time.     Cranial Nerves: No cranial nerve deficit.     Sensory: No sensory deficit.     Motor: No weakness.      ED Treatments / Results  Labs (all labs ordered are listed, but only abnormal results are displayed) Labs Reviewed  BASIC METABOLIC PANEL - Abnormal; Notable for the following components:      Result Value   Glucose, Bld 149 (*)    Calcium 8.8 (*)    All other components within normal limits  CBC - Abnormal; Notable for the following components:   WBC 11.0 (*)    All other components within normal limits  URINALYSIS, ROUTINE W REFLEX MICROSCOPIC  TROPONIN I (HIGH SENSITIVITY)  TROPONIN I (HIGH SENSITIVITY)    EKG EKG Interpretation  Date/Time:  Tuesday July 05 2019 21:11:41 EDT Ventricular Rate:  95 PR Interval:  162 QRS Duration: 98 QT Interval:  350 QTC Calculation: 439 R Axis:   25 Text Interpretation:  Normal sinus rhythm Normal ECG Confirmed by Vanetta Mulders [phone]) on 07/05/2019 10:23:01 PM    Radiology Dg Chest 2 View  Result Date: 07/05/2019 CLINICAL DATA:  43 year old male with chest pain. EXAM: CHEST - 2 VIEW COMPARISON:  Chest radiograph dated 11/09/2017 FINDINGS: There is mild cardiomegaly and mild vascular congestion. Mild chronic bronchitic changes. No focal consolidation, pleural effusion, or pneumothorax. No acute osseous pathology. IMPRESSION: Mild cardiomegaly with mild vascular congestion. No focal consolidation. Electronically Signed   By: Elgie Collard M.D.   On: 07/05/2019 21:43    Procedures Procedures (including critical care time)  Medications Ordered in ED Medications  sodium chloride flush (NS) 0.9 % injection 3 mL (3 mLs Intravenous Not Given 07/05/19 2150)  pantoprazole (PROTONIX) injection  40 mg (40 mg Intravenous Given 07/05/19 2339)     Initial Impression / Assessment and Plan / ED Course  I have reviewed the triage vital signs and the nursing notes.  Pertinent labs & imaging results that were available during my care of the patient were reviewed by me and considered in my medical decision making (see chart for details).     HEAR Score: 3  Patient heart score of 3.  But patient with a concerning story and has not been ongoing too long.  Initial troponin was 5.  Delta troponin has been ordered and is pending.  Did discuss admission for chest pain rule out.  Patient does not want to do that.  He feels it is just indigestion and gas problems.  Will be given some Protonix.  EKG did have some subtle ST segment changes.  Patient is willing to wait for the delta troponin.  However he is not interested in admission unless the delta troponin is significantly changed or elevated.  In addition had a discussion with the patient to take his hypertensive medications on a regular basis daily as directed.  And also to get follow-up to have blood pressure rechecked.   Final Clinical Impressions(s) / ED Diagnoses   Final diagnoses:  Precordial pain    ED  Discharge Orders    None       Fredia Sorrow, MD 07/05/19 2992    Fredia Sorrow, MD 07/05/19 2342

## 2019-07-05 NOTE — ED Triage Notes (Signed)
2100 after eating dinner he had a sudden onset of "feeling funny". He then had a heaviness in his chest. On his way to the ED his left arm started hurting. No cardiac hx.

## 2019-07-05 NOTE — ED Notes (Signed)
Pt. Reports pain in chest mid sternal and mid abd. To upper L chest after eating Pizza tonight.  Pt. Has had episodes of burping.  Pt. In no distress.

## 2019-07-05 NOTE — ED Notes (Signed)
Pt. Sitting on toilet at present time passing gas.  Pt. Asking for gas meds.

## 2019-07-05 NOTE — ED Notes (Signed)
ED Provider at bedside. 

## 2019-07-05 NOTE — ED Notes (Signed)
Pt. Reports he will wait on his Troponin to result and if dangerously high he will stay? But does not feel he is in the best situation to be admitted at this time due to things going on at this time in his life.

## 2019-07-05 NOTE — ED Notes (Signed)
Pt. Has gone to radiology 

## 2019-07-06 ENCOUNTER — Emergency Department (HOSPITAL_BASED_OUTPATIENT_CLINIC_OR_DEPARTMENT_OTHER)
Admission: EM | Admit: 2019-07-06 | Discharge: 2019-07-06 | Disposition: A | Discharge status: Home / Self Care | Payer: Self-pay | Attending: Emergency Medicine | Admitting: Emergency Medicine

## 2019-07-06 ENCOUNTER — Other Ambulatory Visit: Payer: Self-pay

## 2019-07-06 ENCOUNTER — Encounter (HOSPITAL_BASED_OUTPATIENT_CLINIC_OR_DEPARTMENT_OTHER): Payer: Self-pay | Admitting: Emergency Medicine

## 2019-07-06 DIAGNOSIS — I10 Essential (primary) hypertension: Secondary | ICD-10-CM | POA: Insufficient documentation

## 2019-07-06 DIAGNOSIS — Z7984 Long term (current) use of oral hypoglycemic drugs: Secondary | ICD-10-CM | POA: Insufficient documentation

## 2019-07-06 DIAGNOSIS — Z79899 Other long term (current) drug therapy: Secondary | ICD-10-CM | POA: Insufficient documentation

## 2019-07-06 DIAGNOSIS — J45909 Unspecified asthma, uncomplicated: Secondary | ICD-10-CM | POA: Insufficient documentation

## 2019-07-06 LAB — TROPONIN I (HIGH SENSITIVITY): Troponin I (High Sensitivity): 5 ng/L (ref <18)

## 2019-07-06 MED ORDER — HYDROCHLOROTHIAZIDE 25 MG PO TABS: 25.0000 mg | ORAL_TABLET | Freq: Every day | ORAL | 0 refills | Status: DC

## 2019-07-06 MED ORDER — LORATADINE 10 MG PO TABS
10.0000 mg | ORAL_TABLET | Freq: Once | ORAL | Status: AC
  Administered 2019-07-06: 10 mg via ORAL
  Filled 2019-07-06: qty 1

## 2019-07-06 NOTE — ED Triage Notes (Addendum)
Pt states his blood pressure is elevated  Pt states he was seen here last night for the same  Pt denies headache just feel tired and has numbness in both arms   Pt added that he feels like his equilibrium is off

## 2019-07-06 NOTE — ED Provider Notes (Signed)
Delta trop negative.   Fatima Blank, MD 07/06/19 [phone]

## 2019-07-06 NOTE — ED Notes (Signed)
Pt on a/b for sinus infection, but states hasnt started it yet. Denies pain.

## 2019-07-07 ENCOUNTER — Encounter (HOSPITAL_BASED_OUTPATIENT_CLINIC_OR_DEPARTMENT_OTHER): Payer: Self-pay | Admitting: Emergency Medicine

## 2019-07-07 NOTE — ED Provider Notes (Addendum)
[facility] EMERGENCY DEPARTMENT Provider Note   CSN: [id number] Arrival date & time: 07/06/19  2129     History   Chief Complaint Chief Complaint  Patient presents with   Hypertension    HPI Jared Hudson is a 43 y.o. male.     The history is provided by the patient.  Hypertension This is a chronic problem. The current episode started more than 1 week ago. The problem occurs constantly. The problem has not changed since onset.Pertinent negatives include no chest pain, no abdominal pain, no headaches and no shortness of breath. Nothing aggravates the symptoms. Nothing relieves the symptoms. Treatments tried: HCTZ. The treatment provided no relief.  BP was up at home and came in for evaluation.  No CP, no SOB, no DOE.  No changes in vision or speech, no weakness.  No HA.  BP improved without intervention.  Is supposed to start a zpack for allergies but hasn't yet.    Past Medical History:  Diagnosis Date   Asthma    Hypertension     There are no active problems to display for this patient.   Past Surgical History:  Procedure Laterality Date   ADENOIDECTOMY     TONSILLECTOMY          Home Medications    Prior to Admission medications   Medication Sig Start Date End Date Taking? Authorizing Provider  Armodafinil 150 MG tablet Take by mouth. 12/03/18  Yes [provider]  azithromycin (ZITHROMAX) 250 MG tablet Take by mouth daily.   Yes [provider]  cyclobenzaprine (FLEXERIL) 5 MG tablet Take by mouth. 12/03/18  Yes [provider]  fluticasone (FLONASE) 50 MCG/ACT nasal spray 2 sprays by Each Nare route daily as needed. 04/01/19 03/31/20 Yes [provider]  hydrochlorothiazide (HYDRODIURIL) 25 MG tablet Take 1 tablet (25 mg total) by mouth daily. 07/06/19   Jared Harries, MD  metFORMIN (GLUCOPHAGE) 500 MG tablet Take 500 mg by mouth 2 (two) times daily. 01/29/17   [provider]  pantoprazole (PROTONIX)  40 MG tablet Take 1 tablet (40 mg total) by mouth daily. 11/10/17   Hudson, Jared Bison, DO    Family History History reviewed. No pertinent family history.  Social History Social History   Tobacco Use   Smoking status: Never Smoker   Smokeless tobacco: Never Used  Substance Use Topics   Alcohol use: No   Drug use: No     Allergies   Patient has no known allergies.   Review of Systems Review of Systems  Constitutional: Negative for diaphoresis, fatigue and fever.  HENT: Negative for drooling.   Eyes: Negative for visual disturbance.  Respiratory: Negative for shortness of breath.   Cardiovascular: Negative for chest pain.  Gastrointestinal: Negative for abdominal pain.  Genitourinary: Negative for difficulty urinating.  Musculoskeletal: Negative for arthralgias.  Neurological: Negative for dizziness, tremors, seizures, syncope, facial asymmetry, speech difficulty, weakness, light-headedness, numbness and headaches.  Psychiatric/Behavioral: Negative for agitation.  All other systems reviewed and are negative.    Physical Exam Updated Vital Signs BP (!) 135/96 (BP Location: Right Arm)    Pulse 80    Temp 99.1 F (37.3 C) (Oral)    Resp 20    Ht 6\' 3"  (1.905 m)    Wt (!) 152 kg    SpO2 99%    BMI 41.87 kg/m   Physical Exam Vitals signs and nursing note reviewed.  Constitutional:      General: He is not  in acute distress.    Appearance: He is obese.  HENT:     Head: Normocephalic and atraumatic.     Nose: Nose normal.     Mouth/Throat:     Mouth: Mucous membranes are moist.     Pharynx: Oropharynx is clear.  Eyes:     Extraocular Movements: Extraocular movements intact.     Conjunctiva/sclera: Conjunctivae normal.     Pupils: Pupils are equal, round, and reactive to light.  Neck:     Musculoskeletal: Normal range of motion and neck supple.  Cardiovascular:     Rate and Rhythm: Normal rate and regular rhythm.     Pulses: Normal pulses.     Heart sounds:  Normal heart sounds.  Pulmonary:     Effort: Pulmonary effort is normal.     Breath sounds: Normal breath sounds.  Abdominal:     General: Abdomen is flat. Bowel sounds are normal.     Tenderness: There is no abdominal tenderness. There is no guarding.  Musculoskeletal: Normal range of motion.  Skin:    General: Skin is warm and dry.     Capillary Refill: Capillary refill takes less than 2 seconds.  Neurological:     General: No focal deficit present.     Mental Status: He is alert and oriented to person, place, and time.     Cranial Nerves: No cranial nerve deficit.     Deep Tendon Reflexes: Reflexes normal.  Psychiatric:        Mood and Affect: Mood normal.        Behavior: Behavior normal.      ED Treatments / Results  Labs (all labs ordered are listed, but only abnormal results are displayed) Labs Reviewed - No data to display  EKG None  Radiology Dg Chest 2 View  Result Date: 07/05/2019 CLINICAL DATA:  43 year old male with chest pain. EXAM: CHEST - 2 VIEW COMPARISON:  Chest radiograph dated 11/09/2017 FINDINGS: There is mild cardiomegaly and mild vascular congestion. Mild chronic bronchitic changes. No focal consolidation, pleural effusion, or pneumothorax. No acute osseous pathology. IMPRESSION: Mild cardiomegaly with mild vascular congestion. No focal consolidation. Electronically Signed   By: Elgie CollardArash  Radparvar M.D.   On: 07/05/2019 21:43    Procedures Procedures (including critical care time)  Medications Ordered in ED Medications  loratadine (CLARITIN) tablet 10 mg (10 mg Oral Given 07/06/19 2339)     Walked without difficulty.  No neurologic changes.  No CP.  Well appearing.  BP improved on own without intervention.  Will need close follow up in the ED. Seen within 24 hours and had full work up.  I do not believe we need to redo labs etc.  States he sees ENT already for ongoing ear and sinus issues.  He needs to establish care with a PMD.   York RamKevin L Hudson was  evaluated in Emergency Department on 07/07/2019 for the symptoms described in the history of present illness. He was evaluated in the context of the global COVID-19 pandemic, which necessitated consideration that the patient might be at risk for infection with the SARS-CoV-2 virus that causes COVID-19. Institutional protocols and algorithms that pertain to the evaluation of patients at risk for COVID-19 are in a state of rapid change based on information released by regulatory bodies including the CDC and federal and state organizations. These policies and algorithms were followed during the patient's care in the ED.  Final Clinical Impressions(s) / ED Diagnoses   Final diagnoses:  Essential hypertension  Return for intractable cough, coughing up blood,fevers >100.4 unrelieved by medication, shortness of breath, intractable vomiting, chest pain, shortness of breath, weakness,numbness, changes in speech, facial asymmetry,abdominal pain, passing out,Inability to tolerate liquids or food, cough, altered mental status or any concerns. No signs of systemic illness or infection. The patient is nontoxic-appearing on exam and vital signs are within normal limits.   I have reviewed the triage vital signs and the nursing notes. Pertinent labs &imaging results that were available during my care of the patient were reviewed by me and considered in my medical decision making (see chart for details).After history, exam, and medical workup I feel the patient has beenappropriately medically screened and is safe for discharge home. Pertinent diagnoses were discussed with the patient. Patient was given return precautions.   ED Discharge Orders         Ordered    hydrochlorothiazide (HYDRODIURIL) 25 MG tablet  Daily     07/06/19 2312           Jaleiah Asay, MD 07/07/19 Pasty Arch, Shereka Lafortune, MD 07/07/19 8756

## 2019-11-16 ENCOUNTER — Ambulatory Visit: Payer: Self-pay | Attending: Internal Medicine

## 2019-11-16 DIAGNOSIS — Z20822 Contact with and (suspected) exposure to covid-19: Secondary | ICD-10-CM

## 2019-11-17 LAB — NOVEL CORONAVIRUS, NAA: SARS-CoV-2, NAA: NOT DETECTED

## 2019-12-26 ENCOUNTER — Emergency Department (HOSPITAL_BASED_OUTPATIENT_CLINIC_OR_DEPARTMENT_OTHER)
Admission: EM | Admit: 2019-12-26 | Discharge: 2019-12-26 | Disposition: A | Discharge status: Home / Self Care | Payer: Self-pay | Attending: Emergency Medicine | Admitting: Emergency Medicine

## 2019-12-26 ENCOUNTER — Encounter (HOSPITAL_BASED_OUTPATIENT_CLINIC_OR_DEPARTMENT_OTHER): Payer: Self-pay | Admitting: *Deleted

## 2019-12-26 ENCOUNTER — Other Ambulatory Visit: Payer: Self-pay

## 2019-12-26 ENCOUNTER — Ambulatory Visit: Payer: Self-pay | Attending: Internal Medicine

## 2019-12-26 DIAGNOSIS — Z7984 Long term (current) use of oral hypoglycemic drugs: Secondary | ICD-10-CM | POA: Insufficient documentation

## 2019-12-26 DIAGNOSIS — E119 Type 2 diabetes mellitus without complications: Secondary | ICD-10-CM | POA: Insufficient documentation

## 2019-12-26 DIAGNOSIS — H539 Unspecified visual disturbance: Secondary | ICD-10-CM

## 2019-12-26 DIAGNOSIS — J45909 Unspecified asthma, uncomplicated: Secondary | ICD-10-CM | POA: Insufficient documentation

## 2019-12-26 DIAGNOSIS — I10 Essential (primary) hypertension: Secondary | ICD-10-CM | POA: Insufficient documentation

## 2019-12-26 DIAGNOSIS — Z79899 Other long term (current) drug therapy: Secondary | ICD-10-CM | POA: Insufficient documentation

## 2019-12-26 DIAGNOSIS — H538 Other visual disturbances: Secondary | ICD-10-CM | POA: Insufficient documentation

## 2019-12-26 DIAGNOSIS — Z20822 Contact with and (suspected) exposure to covid-19: Secondary | ICD-10-CM | POA: Insufficient documentation

## 2019-12-26 LAB — CBC
HCT: 46.4 % (ref 39.0–52.0)
Hemoglobin: 14.8 g/dL (ref 13.0–17.0)
MCH: 31 pg (ref 26.0–34.0)
MCHC: 31.9 g/dL (ref 30.0–36.0)
MCV: 97.3 fL (ref 80.0–100.0)
Platelets: 207 10*3/uL (ref 150–400)
RBC: 4.77 MIL/uL (ref 4.22–5.81)
RDW: 12.9 % (ref 11.5–15.5)
WBC: 11 10*3/uL — ABNORMAL HIGH (ref 4.0–10.5)
nRBC: 0 % (ref 0.0–0.2)

## 2019-12-26 LAB — BASIC METABOLIC PANEL
Anion gap: 8 (ref 5–15)
BUN: 14 mg/dL (ref 6–20)
CO2: 29 mmol/L (ref 22–32)
Calcium: 9.4 mg/dL (ref 8.9–10.3)
Chloride: 101 mmol/L (ref 98–111)
Creatinine, Ser: 1.11 mg/dL (ref 0.61–1.24)
GFR calc Af Amer: >60 mL/min (ref >60)
GFR calc non Af Amer: >60 mL/min (ref >60)
Glucose, Bld: 114 mg/dL — ABNORMAL HIGH (ref 70–99)
Potassium: 3.9 mmol/L (ref 3.5–5.1)
Sodium: 138 mmol/L (ref 135–145)

## 2019-12-26 LAB — HEMOGLOBIN A1C
Hgb A1c MFr Bld: 6.9 % — ABNORMAL HIGH (ref 4.8–5.6)
Mean Plasma Glucose: 151.33 mg/dL

## 2019-12-26 NOTE — ED Notes (Signed)
PT smiling, talkative. Denies pain or blurred vision. Requesting po food.

## 2019-12-26 NOTE — ED Provider Notes (Signed)
[facility] EMERGENCY DEPARTMENT Provider Note   CSN: [id number] Arrival date & time: 12/26/19  1805     History Chief Complaint  Patient presents with  . Blurred Vision    Jared Hudson is a 44 y.o. male.  Presents emergency department with intermittent blurry vision for the past month.  Patient reports that he periodically has episodes where both of his eyes seem to be somewhat blurry, does not have loss of vision, no pain, no tunnel vision, still is able to see with both eyes but has a hard time concentrating.  Not associated with any numbness, weakness, speech changes.  No episodes of chest pain difficulty breathing or passing out.  Covid exposure yesterday, got tested yesterday, results pending.  Not currently having any symptoms at this time.  Past medical history diabetes, not on insulin.  Does not check sugars regularly.  HPI     Past Medical History:  Diagnosis Date  . Asthma   . Hypertension     There are no problems to display for this patient.   Past Surgical History:  Procedure Laterality Date  . ADENOIDECTOMY    . TONSILLECTOMY         History reviewed. No pertinent family history.  Social History   Tobacco Use  . Smoking status: Never Smoker  . Smokeless tobacco: Never Used  Substance Use Topics  . Alcohol use: No  . Drug use: No    Home Medications Prior to Admission medications   Medication Sig Start Date End Date Taking? Authorizing Provider  Armodafinil 150 MG tablet Take by mouth. 12/03/18   [provider]  azithromycin (ZITHROMAX) 250 MG tablet Take by mouth daily.    [provider]  cyclobenzaprine (FLEXERIL) 5 MG tablet Take by mouth. 12/03/18   [provider]  fluticasone (FLONASE) 50 MCG/ACT nasal spray 2 sprays by Each Nare route daily as needed. 04/01/19 03/31/20  [provider]  hydrochlorothiazide (HYDRODIURIL) 25 MG tablet Take 1 tablet (25 mg total) by mouth daily. 07/06/19    Palumbo, April, MD  metFORMIN (GLUCOPHAGE) 500 MG tablet Take 500 mg by mouth 2 (two) times daily. 01/29/17   [provider]  pantoprazole (PROTONIX) 40 MG tablet Take 1 tablet (40 mg total) by mouth daily. 11/10/17   Ward, Delice Bison, DO    Allergies    Patient has no known allergies.  Review of Systems   Review of Systems  Constitutional: Negative for chills and fever.  HENT: Negative for ear pain and sore throat.   Eyes: Negative for pain and visual disturbance.  Respiratory: Negative for cough and shortness of breath.   Cardiovascular: Negative for chest pain and palpitations.  Gastrointestinal: Negative for abdominal pain and vomiting.  Genitourinary: Negative for dysuria and hematuria.  Musculoskeletal: Negative for arthralgias and back pain.  Skin: Negative for color change and rash.  Neurological: Negative for seizures and syncope.  All other systems reviewed and are negative.   Physical Exam Updated Vital Signs BP (!) 156/87   Pulse 88   Temp 99 F (37.2 C) (Oral)   Resp 18   Ht 6\' 3"  (1.905 m)   Wt (!) 158.3 kg   SpO2 98%   BMI 43.62 kg/m   Physical Exam Vitals and nursing note reviewed.  Constitutional:      Appearance: He is well-developed.  HENT:     Head: Normocephalic and atraumatic.  Eyes:     Conjunctiva/sclera: Conjunctivae normal.  Cardiovascular:  Rate and Rhythm: Normal rate and regular rhythm.     Heart sounds: No murmur.  Pulmonary:     Effort: Pulmonary effort is normal. No respiratory distress.     Breath sounds: Normal breath sounds.  Abdominal:     Palpations: Abdomen is soft.     Tenderness: There is no abdominal tenderness.  Musculoskeletal:     Cervical back: Neck supple.  Skin:    General: Skin is warm and dry.     Capillary Refill: Capillary refill takes less than 2 seconds.  Neurological:     General: No focal deficit present.     Mental Status: He is alert and oriented to person, place, and time.     Cranial  Nerves: No cranial nerve deficit.     Sensory: No sensory deficit.     Motor: No weakness.     Comments: Normal finger-nose-finger, normal gait, normal visual fields     ED Results / Procedures / Treatments   Labs (all labs ordered are listed, but only abnormal results are displayed) Labs Reviewed  CBC - Abnormal; Notable for the following components:      Result Value   WBC 11.0 (*)    All other components within normal limits  BASIC METABOLIC PANEL - Abnormal; Notable for the following components:   Glucose, Bld 114 (*)    All other components within normal limits  HEMOGLOBIN A1C    EKG None  Radiology No results found.  Procedures Procedures (including critical care time)  Medications Ordered in ED Medications - No data to display  ED Course  I have reviewed the triage vital signs and the nursing notes.  Pertinent labs & imaging results that were available during my care of the patient were reviewed by me and considered in my medical decision making (see chart for details).    MDM Rules/Calculators/A&P                      44 year old male presenting to ER with intermittent episodes of blurred vision.  Notably, no visual loss, still able to see during episodes, no pain.  Bilateral.  Here, well-appearing, no ongoing symptoms, 20/20 on visual acuity bilateral.  No neurologic deficits on comprehensive neurologic exam.  Labs grossly within normal limits.   Symptomatology and exam not consistent with TIA or stroke.  I do not feel he needs emergent head imaging tonight.  Believe patient would benefit from eye exam given his diabetes and the symptoms.  Recommend close outpatient follow-up with ophthalmology and primary care doctor.    After the discussed management above, the patient was determined to be safe for discharge.  The patient was in agreement with this plan and all questions regarding their care were answered.  ED return precautions were discussed and the patient  will return to the ED with any significant worsening of condition.   Final Clinical Impression(s) / ED Diagnoses Final diagnoses:  Visual disturbance    Rx / DC Orders ED Discharge Orders    None       Milagros Loll, MD 12/27/19 1539

## 2019-12-26 NOTE — ED Triage Notes (Addendum)
Pt reports intermittent blurry vision x at least one month.  Pt noncompliant with BP meds, denies pain.  Reports being exposed to covid recently and having a test done this morning with pending results.

## 2019-12-26 NOTE — Discharge Instructions (Signed)
Recommend schedule follow-up both with your primary care doctor as well as ophthalmology regarding your symptoms today.  Return to ER if you develop any recurrent blurred vision, numbness, weakness, speech changes or other new concerning symptom.

## 2019-12-27 LAB — NOVEL CORONAVIRUS, NAA: SARS-CoV-2, NAA: NOT DETECTED

## 2020-03-09 ENCOUNTER — Other Ambulatory Visit: Payer: Self-pay

## 2020-03-09 ENCOUNTER — Encounter (HOSPITAL_BASED_OUTPATIENT_CLINIC_OR_DEPARTMENT_OTHER): Payer: Self-pay | Admitting: *Deleted

## 2020-03-09 DIAGNOSIS — J45909 Unspecified asthma, uncomplicated: Secondary | ICD-10-CM | POA: Insufficient documentation

## 2020-03-09 DIAGNOSIS — Z79899 Other long term (current) drug therapy: Secondary | ICD-10-CM | POA: Insufficient documentation

## 2020-03-09 DIAGNOSIS — R12 Heartburn: Secondary | ICD-10-CM | POA: Insufficient documentation

## 2020-03-09 DIAGNOSIS — I10 Essential (primary) hypertension: Secondary | ICD-10-CM | POA: Insufficient documentation

## 2020-03-09 DIAGNOSIS — G608 Other hereditary and idiopathic neuropathies: Secondary | ICD-10-CM | POA: Insufficient documentation

## 2020-03-09 NOTE — ED Triage Notes (Addendum)
Since last nigh he has had pain on the right side of his head with what he describes as an indention to his scalp. He has dry taste in his mouth and a rubbery feeling to the right side of his face that comes and goes. He was recently diagnosed with sinusitis and given an antibiotic that he has not started. Cold air makes the pain worse.

## 2020-03-10 ENCOUNTER — Emergency Department (HOSPITAL_BASED_OUTPATIENT_CLINIC_OR_DEPARTMENT_OTHER)
Admission: EM | Admit: 2020-03-10 | Discharge: 2020-03-10 | Disposition: A | Discharge status: Home / Self Care | Payer: Self-pay | Attending: Emergency Medicine | Admitting: Emergency Medicine

## 2020-03-10 DIAGNOSIS — R12 Heartburn: Secondary | ICD-10-CM

## 2020-03-10 DIAGNOSIS — G608 Other hereditary and idiopathic neuropathies: Secondary | ICD-10-CM

## 2020-03-10 MED ORDER — OMEPRAZOLE 40 MG PO CPDR
40.0000 mg | DELAYED_RELEASE_CAPSULE | Freq: Every day | ORAL | 0 refills | Status: AC
Start: 2020-03-10 — End: ?

## 2020-03-10 MED ORDER — PANTOPRAZOLE SODIUM 40 MG PO TBEC
40.0000 mg | DELAYED_RELEASE_TABLET | Freq: Once | ORAL | Status: AC
Start: 2020-03-10 — End: 2020-03-10
  Administered 2020-03-10: 40 mg via ORAL
  Filled 2020-03-10: qty 1

## 2020-03-10 MED ORDER — NAPROXEN 250 MG PO TABS
500.0000 mg | ORAL_TABLET | Freq: Once | ORAL | Status: AC
  Administered 2020-03-10: 500 mg via ORAL
  Filled 2020-03-10: qty 2

## 2020-03-10 NOTE — ED Provider Notes (Signed)
[facility] Provider Note: [name], MD, FACEP  CSN: [id number] MRN: [id number] ARRIVAL: 03/09/20 at 2238 ROOM: [place]  Facial Pain and Headache   HISTORY OF PRESENT ILLNESS  03/10/20 2:19 AM Jared Hudson is a 44 y.o. male who has a somewhat longstanding history of episodic pain on the right side of his head.  The pain originates at the top of his head above the right ear and radiates down to the right side of the face as far down as the angle of the mandible.  He had this occur 2 days ago.  The pain was severe and he characterized it as having both constant and intermittent qualities.  He took ibuprofen and went to sleep and woke up improved.  He had the pain occur again yesterday and became concerned he may be having a stroke.  Again the pain resolved with ibuprofen and he is asymptomatic presently.  He had no obvious facial droop with this but felt like his face was "rubbery".  The pain was not worsened with palpation of his scalp or face.  The episodes seem to be triggered by exposure to cold air.  He had a telemedicine visit 2 days ago where he was diagnosed with sinusitis.  He was prescribed Augmentin, Patanol eyedrops and Flonase for treatment of presumed allergic etiology.  He has not started the Augmentin.  He has been having about a month of intermittent lower chest/epigastric discomfort which she describes as heartburn.  It has been relieved with taking his wife's omeprazole but he is not on any chronic medications for this.   Past Medical History:  Diagnosis Date  . Asthma   . Hypertension     Past Surgical History:  Procedure Laterality Date  . ADENOIDECTOMY    . TONSILLECTOMY      No family history on file.  Social History   Tobacco Use  . Smoking status: Never Smoker  . Smokeless tobacco: Never Used  Substance Use Topics  . Alcohol use: No  . Drug use: No    Prior to Admission medications   Medication Sig Start  Date End Date Taking? Authorizing Provider  Armodafinil 150 MG tablet Take by mouth. 12/26/19  Yes [provider]  hydrochlorothiazide (HYDRODIURIL) 25 MG tablet Take by mouth. 12/30/19 12/29/20 Yes [provider]  metFORMIN (GLUCOPHAGE) 500 MG tablet Take by mouth. 03/18/19 03/17/20 Yes [provider]  omeprazole (PRILOSEC) 40 MG capsule Take 1 capsule (40 mg total) by mouth daily. 03/10/20   Emmie Frakes, MD  fluticasone (FLONASE) 50 MCG/ACT nasal spray 2 sprays by Each Nare route daily as needed. 04/01/19 03/10/20  [provider]  pantoprazole (PROTONIX) 40 MG tablet Take 1 tablet (40 mg total) by mouth daily. 11/10/17 03/10/20  Ward, Delice Bison, DO    Allergies Patient has no known allergies.   REVIEW OF SYSTEMS  Negative except as noted here or in the History of Present Illness.   PHYSICAL EXAMINATION  Initial Vital Signs Blood pressure (!) 178/82, pulse 86, temperature 98.2 F (36.8 C), temperature source Oral, resp. rate 18, height 6\' 3"  (1.905 m), weight (!) 154.2 kg, SpO2 99 %.  Examination General: Well-developed, well-nourished male in no acute distress; appearance consistent with age of record HENT: normocephalic; atraumatic; no scalp tenderness Eyes: pupils equal, round and reactive to light; extraocular muscles intact Neck: supple Heart: regular rate and rhythm Lungs: clear to auscultation bilaterally Abdomen: soft; nondistended; nontender; bowel sounds present  Extremities: No deformity; full range of motion; pulses normal Neurologic: Awake, alert and oriented; motor function intact in all extremities and symmetric; sensation intact and symmetric in face without tenderness or hyperesthesia; no facial droop, smile symmetric, tongue midline with symmetric lateral movements Skin: Warm and dry Psychiatric: Normal mood and affect   RESULTS  Summary of this visit's results, reviewed and interpreted by myself:   EKG  Interpretation  Date/Time:    Ventricular Rate:    PR Interval:    QRS Duration:   QT Interval:    QTC Calculation:   R Axis:     Text Interpretation:        Laboratory Studies: No results found for this or any previous visit (from the past 24 hour(s)). Imaging Studies: No results found.  ED COURSE and MDM  Nursing notes, initial and subsequent vitals signs, including pulse oximetry, reviewed and interpreted by myself.  Vitals:   03/09/20 2246 03/09/20 2251 03/10/20 0213  BP:  133/89 137/90  Pulse:  87 86  Resp:  20 18  Temp:  98.2 F (36.8 C)   TempSrc:  Oral   SpO2:  95% 99%  Weight: (!) 154.2 kg    Height: 6\' 3"  (1.905 m)     Medications  pantoprazole (PROTONIX) EC tablet 40 mg (has no administration in time range)  naproxen (NAPROSYN) tablet 500 mg (has no administration in time range)    The patient's pain is concerning for an atypical trigeminal neuralgia or possibly atypical migraine.  Trigeminal neuralgia can be triggered by exposure to cold air.  I do not believe any emergent imaging is indicated at this time.  He does say that he has had episodes of vertigo-like symptoms when this pain occurs.  The possibility of an MRI has been discussed but he did not tolerate even an open MRI.  He has a neurologist at [facility] with whom he can follow-up for further evaluation.  He was reassured that his symptoms are not suggestive of a stroke.  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 Heartburn  R12        Sayid Moll, MD 03/10/20 [phone]

## 2020-10-30 ENCOUNTER — Other Ambulatory Visit: Payer: Self-pay

## 2020-10-30 ENCOUNTER — Emergency Department (HOSPITAL_BASED_OUTPATIENT_CLINIC_OR_DEPARTMENT_OTHER)
Admission: EM | Admit: 2020-10-30 | Discharge: 2020-10-30 | Disposition: A | Discharge status: Home / Self Care | Payer: 59 | Attending: Emergency Medicine | Admitting: Emergency Medicine

## 2020-10-30 ENCOUNTER — Encounter (HOSPITAL_BASED_OUTPATIENT_CLINIC_OR_DEPARTMENT_OTHER): Payer: Self-pay | Admitting: *Deleted

## 2020-10-30 DIAGNOSIS — H9202 Otalgia, left ear: Secondary | ICD-10-CM | POA: Insufficient documentation

## 2020-10-30 DIAGNOSIS — K0889 Other specified disorders of teeth and supporting structures: Secondary | ICD-10-CM | POA: Diagnosis not present

## 2020-10-30 DIAGNOSIS — Z5321 Procedure and treatment not carried out due to patient leaving prior to being seen by health care provider: Secondary | ICD-10-CM | POA: Insufficient documentation

## 2020-10-30 NOTE — ED Triage Notes (Addendum)
C/o left ear pain and dental pain x 1 week , seen by dentist last week prescribed ABX Amoxil capsules not taking d/t " pills to big"

## 2020-11-26 ENCOUNTER — Other Ambulatory Visit: Payer: Self-pay

## 2020-11-26 ENCOUNTER — Ambulatory Visit (INDEPENDENT_AMBULATORY_CARE_PROVIDER_SITE_OTHER): Payer: 59 | Admitting: Allergy & Immunology

## 2020-11-26 ENCOUNTER — Encounter: Payer: Self-pay | Admitting: Allergy & Immunology

## 2020-11-26 VITALS — BP 138/92 | HR 86 | Temp 98.9°F | Resp 20 | Ht 74.0 in | Wt 349.6 lb

## 2020-11-26 DIAGNOSIS — J310 Chronic rhinitis: Secondary | ICD-10-CM

## 2020-11-26 DIAGNOSIS — J4530 Mild persistent asthma, uncomplicated: Secondary | ICD-10-CM | POA: Diagnosis not present

## 2020-11-26 DIAGNOSIS — J302 Other seasonal allergic rhinitis: Secondary | ICD-10-CM

## 2020-11-26 DIAGNOSIS — T7800XD Anaphylactic reaction due to unspecified food, subsequent encounter: Secondary | ICD-10-CM | POA: Diagnosis not present

## 2020-11-26 DIAGNOSIS — L2089 Other atopic dermatitis: Secondary | ICD-10-CM

## 2020-11-26 DIAGNOSIS — J3089 Other allergic rhinitis: Secondary | ICD-10-CM | POA: Diagnosis not present

## 2020-11-26 MED ORDER — ALVESCO 160 MCG/ACT IN AERS: 2.0000 | INHALATION_SPRAY | Freq: Two times a day (BID) | RESPIRATORY_TRACT | 5 refills | Status: AC

## 2020-11-26 MED ORDER — EPINEPHRINE 0.3 MG/0.3ML IJ SOAJ: 0.3000 mg | Freq: Once | INTRAMUSCULAR | 1 refills | Status: AC

## 2020-11-26 NOTE — Patient Instructions (Addendum)
1. Anaphylactic shock due to food - Testing was negative to egg, but continue to avoid since you have reacted so recently. - EpiPen training provided. - Anaphylaxis management plan provided.  2. Mild persistent asthma, uncomplicated - Lung testing was in the 65% range and it did not improve with the albuterol treatment. - We are adding on an inhaled steroid to see if this helpos decrease your sensation of needing albuterol.  - Spacer sample and demonstration provided. - Daily controller medication(s): Alvesco 160mg  two puffs twice daily with spacer - Prior to physical activity: albuterol 2 puffs 10-15 minutes before physical activity. - Rescue medications: albuterol 4 puffs every 4-6 hours as needed - Changes during respiratory infections or worsening symptoms: Increase Alvesco 160mg  to 4 puffs twice daily for TWO WEEKS. - Asthma control goals:  * Full participation in all desired activities (may need albuterol before activity) * Albuterol use two time or less a week on average (not counting use with activity) * Cough interfering with sleep two time or less a month * Oral steroids no more than once a year * No hospitalizations  3. Chronic rhinitis - Testing today showed: grasses, ragweed and dust mites - Copy of test results provided.  - Avoidance measures provided. - Start taking: Zyrtec (cetirizine) 10mg  tablet once daily and Flonase (fluticasone) two sprays per nostril daily - You can use an extra dose of the antihistamine, if needed, for breakthrough symptoms.  - Consider nasal saline rinses 1-2 times daily to remove allergens from the nasal cavities as well as help with mucous clearance (this is especially helpful to do before the nasal sprays are given) - Consider allergy shots as a means of long-term control. - Allergy shots "re-train" and "reset" the immune system to ignore environmental allergens and decrease the resulting immune response to those allergens (sneezing, itchy  watery eyes, runny nose, nasal congestion, etc).    - Allergy shots improve symptoms in 75-85% of patients.  - We can discuss more at the next appointment if the medications are not working for you.  4. Flexural atopic dermatitis - Skin looks great. - We will add on triamcinolone compounded with Eucerin twice daily as needed for flares.   5. Return in about 4 weeks (around 12/24/2020).    Please inform of any Emergency Department visits, hospitalizations, or changes in symptoms. Call [phone] before going to the ED for breathing or allergy symptoms since we might be able to fit you in for a sick visit. Feel free to contact 12/26/2020 anytime with any questions, problems, or concerns.  It was a pleasure to meet you today!  Websites that have reliable patient information: 1. American Academy of Asthma, Allergy, and Immunology: www.aaaai.org 2. Food Allergy Research and Education (FARE): foodallergy.org 3. Mothers of Asthmatics: http://www.asthmacommunitynetwork.org 4. American College of Allergy, Asthma, and Immunology: www.acaai.org   COVID-19 Vaccine Information can be found at: [place] For questions related to vaccine distribution or appointments, please email [email or web address] or call [phone].   We realize that you might be concerned about having an allergic reaction to the COVID19 vaccines. To help with that concern, WE ARE OFFERING THE COVID19 VACCINES IN OUR OFFICE! Ask the front desk for dates!     "Like" [place] on Facebook and Instagram for our latest updates!       Make sure you are registered to vote! If you have moved or changed any of your contact information, you will need to get this updated before voting!  In some  cases, you MAY be able to register to vote online: [email or web address]     Airborne Adult Perc - 11/26/20 1505    Time Antigen Placed 1505    Allergen  Manufacturer Waynette Buttery    Location Back    Number of Test 59    1. Control-Buffer 50% Glycerol Negative    2. Control-Histamine 1 mg/ml 3+    3. Albumin saline Negative    4. Bahia Negative    5. [place] Negative    6. Johnson Negative    7. Kentucky Blue Negative    8. Meadow Fescue Negative    9. Perennial Rye Negative    10. Sweet Vernal Negative    11. Timothy Negative    12. Cocklebur Negative    13. Burweed Marshelder Negative    14. Ragweed, short Negative    15. Ragweed, Giant Negative    16. Plantain,  English Negative    17. Lamb's Quarters Negative    18. Sheep Sorrell Negative    19. Rough Pigweed Negative    20. Marsh Elder, Rough Negative    21. Mugwort, Common Negative    22. Ash mix Negative    23. Birch mix Negative    24. Beech American Negative    25. Box, Elder Negative    26. Cedar, red Negative    27. Cottonwood, Guinea-Bissau Negative    28. Elm mix Negative    29. Hickory Negative    30. Maple mix Negative    31. Oak, Guinea[place]issau mix Negative    32. Pecan Pollen Negative    33. Pine mix Negative    34. Sycamore Eastern Negative    35. Walnut, Black Pollen Negative    36. Alternaria alternata Negative    37. Cladosporium Herbarum Negative    38. Aspergillus mix Negative    39. Penicillium mix Negative    40. Bipolaris sorokiniana (Helminthosporium) Negative    41. Drechslera spicifera (Curvularia) Negative    42. Mucor plumbeus Negative    43. Fusarium moniliforme Negative    44. Aureobasidium pullulans (pullulara) Negative    45. Rhizopus oryzae Negative    46. Botrytis cinera Negative    47. Epicoccum nigrum Negative    48. Phoma betae Negative    49. Candida Albicans Negative    50. Trichophyton mentagrophytes Negative    51. Mite, D Farinae  5,000 AU/ml Negative    52. Mite, D Pteronyssinus  5,000 AU/ml Negative    53. Cat Hair 10,000 BAU/ml Negative    54.  Dog Epithelia Negative    55. Mixed Feathers Negative    56. Horse Epithelia Negative     57. Cockroach, German Negative    58. Mouse Negative    59. Tobacco Leaf Negative          Food Perc - 11/26/20 1506      Test Information   Time Antigen Placed 1505    Allergen Manufacturer Waynette Buttery    Location Back    Number of allergen test 1      Food   6. Egg White, chicken Negative          Intradermal - 11/26/20 1550    Time Antigen Placed 1551    Allergen Manufacturer Waynette Buttery    Location Arm    Number of Test 15    Control Negative    [place] Territ[place] Negative    Johnson 1+    7 Grass Negative    Ragweed mix 1+  Weed mix Negative    Tree mix Negative    Mold 1 Negative    Mold 2 Negative    Mold 3 Negative    Mold 4 Negative    Cat Negative    Dog Negative    Cockroach Negative    Mite mix 1+    Other Omitted            Reducing Pollen Exposure  The American Academy of Allergy, Asthma and Immunology suggests the following steps to reduce your exposure to pollen during allergy seasons.    1. Do not hang sheets or clothing out to dry; pollen may collect on these items. 2. Do not mow lawns or spend time around freshly cut grass; mowing stirs up pollen. 3. Keep windows closed at night.  Keep car windows closed while driving. 4. Minimize morning activities outdoors, a time when pollen counts are usually at their highest. 5. Stay indoors as much as possible when pollen counts or humidity is high and on windy days when pollen tends to remain in the air longer. 6. Use air conditioning when possible.  Many air conditioners have filters that trap the pollen spores. 7. Use a HEPA room air filter to remove pollen form the indoor air you breathe.    Control of Dust Mite Allergen    Dust mites play a major role in allergic asthma and rhinitis.  They occur in environments with high humidity wherever human skin is found.  Dust mites absorb humidity from the atmosphere (ie, they do not drink) and feed on organic matter (including shed human and animal skin).  Dust mites are  a microscopic type of insect that you cannot see with the naked eye.  High levels of dust mites have been detected from mattresses, pillows, carpets, upholstered furniture, bed covers, clothes, soft toys and any woven material.  The principal allergen of the dust mite is found in its feces.  A gram of dust may contain 1,000 mites and 250,000 fecal particles.  Mite antigen is easily measured in the air during house cleaning activities.  Dust mites do not bite and do not cause harm to humans, other than by triggering allergies/asthma.    Ways to decrease your exposure to dust mites in your home:  1. Encase mattresses, box springs and pillows with a mite-impermeable barrier or cover   2. Wash sheets, blankets and drapes weekly in hot water (130 F) with detergent and dry them in a dryer on the hot setting.  3. Have the room cleaned frequently with a vacuum cleaner and a damp dust-mop.  For carpeting or rugs, vacuuming with a vacuum cleaner equipped with a high-efficiency particulate air (HEPA) filter.  The dust mite allergic individual should not be in a room which is being cleaned and should wait 1 hour after cleaning before going into the room. 4. Do not sleep on upholstered furniture (eg, couches).   5. If possible removing carpeting, upholstered furniture and drapery from the home is ideal.  Horizontal blinds should be eliminated in the rooms where the person spends the most time (bedroom, study, television room).  Washable vinyl, roller-type shades are optimal. 6. Remove all non-washable stuffed toys from the bedroom.  Wash stuffed toys weekly like sheets and blankets above.   7. Reduce indoor humidity to less than 50%.  Inexpensive humidity monitors can be purchased at most hardware stores.  Do not use a humidifier as can make the problem worse and are not recommended.

## 2020-11-26 NOTE — Progress Notes (Unsigned)
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 Anaphylactic shock due to food - Testing was negative to egg, but continue to avoid since you have reacted so recently. - EpiPen training provided. - Anaphylaxis management plan provided.  2. Mild persistent asthma, uncomplicated - Lung testing was in the 65% range and it did not improve with the albuterol treatment. - We are adding on an inhaled steroid to see if this helpos decrease your sensation of needing albuterol.  - Spacer sample and demonstration provided. - Daily controller medication(s): Alvesco 124m two puffs twice daily with spacer - Prior to physical activity: albuterol 2 puffs 10-15 minutes before physical activity. - Rescue medications: albuterol 4 puffs every 4-6 hours as needed - Changes during respiratory infections or worsening symptoms: Increase Alvesco 1626mto 4 puffs twice daily for TWO WEEKS. - Asthma control goals:  * Full participation in all desired activities (may need albuterol before activity) * Albuterol use two time or less a week on average (not counting use with activity) * Cough interfering with sleep two time or less a month * Oral steroids no more than once a year * No hospitalizations  3. Chronic rhinitis - Testing today showed: grasses, ragweed and dust mites - Copy of test results provided.  - Avoidance measures provided. - Start taking: Zyrtec (cetirizine) 1060mablet once daily and Flonase (fluticasone) two sprays per nostril daily - You can use an extra dose of the antihistamine, if needed, for breakthrough symptoms.  - Consider nasal saline rinses 1-2 times daily to remove allergens from the nasal cavities as well as help with  mucous clearance (this is especially helpful to do before the nasal sprays are given) - Consider allergy shots as a means of long-term control. - Allergy shots "re-train" and "reset" the immune system to ignore environmental allergens and decrease the resulting immune response to those allergens (sneezing, itchy watery eyes, runny nose, nasal congestion, etc).    - Allergy shots improve symptoms in 75-85% of patients.  - We can discuss more at the next appointment if the medications are not working for you.  4. Flexural atopic dermatitis - Skin looks great. - We will add on triamcinolone compounded with Eucerin twice daily as needed for flares.   5.  Follow-up in 4 weeks or earlier as needed.  Subjective:   KevMUKUND WEINREB a 44 40o. male presenting today for evaluation of  Chief Complaint  Patient presents with  . Allergic Rhinitis   . Asthma  . Food Intolerance    KevRAYVEN HENDRICKSONs a history of the following: There are no problems to display for this patient.   History obtained from: chart review and patient.  KevNicholos Johnss referred by YaoCarolan ShiverD.     KevJavell a 44 1o. male presenting for an evaluation of multiple atopic complaints.   Asthma/Respiratory Symptom History: He has asthma that is constantly a problem, per the patient. He did have an albuterol inhaler at one point. He has not had one in three years. He has needed steroids for his asthma.   Allergic Rhinitis Symptom History: He does report sneezing and watery eyes with congestion. He does have postnasal drip and coughing. He was tested for environmental  allergies and had allergy shots for a period of time. He was allergic to "everything". He received shots in [place].   Food Allergy Symptom History: He ate some eggs when he was a kid and then developed problems breathing. He has avoided eggs since that time. He does cakes and cookies without a problem. He cannot tolerate them baked in mac  and cheese. He has reacted to mayonnaise. The las ttime that he was tested was in the 1980s.  His most recent reaction was around 2 years ago to the homemade macaroni and cheese.  Eczema Symptom History: He does have eczema which is worse on his back. He did have some topical cream that his uncle gave him "years ago". But he does not remember what it was.   Otherwise, there is no history of other atopic diseases, including drug allergies, stinging insect allergies, urticaria or contact dermatitis. There is no significant infectious history. Vaccinations are up to date.    Past Medical History: There are no problems to display for this patient.   Medication List:  Allergies as of 11/26/2020      Reactions   Eggs Or Egg-derived Products    Throat closing   Other Nausea And Vomiting   maynaise      Medication List       Accurate as of November 26, 2020  4:14 PM. If you have any questions, ask your nurse or doctor.        STOP taking these medications   amoxicillin 500 MG capsule Commonly known as: AMOXIL Stopped by: Valentina Shaggy, MD   Armodafinil 150 MG tablet Stopped by: Valentina Shaggy, MD     TAKE these medications   amLODipine 5 MG tablet Commonly known as: NORVASC Take by mouth.   fluticasone 50 MCG/ACT nasal spray Commonly known as: FLONASE 2 sprays into each nostril daily.   hydrochlorothiazide 25 MG tablet Commonly known as: HYDRODIURIL Take by mouth.   ibuprofen 600 MG tablet Commonly known as: ADVIL TAKE 1 TABLET BY MOUTH EVERY 6 HOURS FOR 3 DAYS   metFORMIN 500 MG tablet Commonly known as: GLUCOPHAGE Take by mouth.   omeprazole 40 MG capsule Commonly known as: PRILOSEC Take 1 capsule (40 mg total) by mouth daily.       Birth History: non-contributory  Developmental History: non-contributory  Past Surgical History: Past Surgical History:  Procedure Laterality Date  . ADENOIDECTOMY    . TONSILLECTOMY       Family  History: Family History  Problem Relation Age of Onset  . Asthma Paternal Grandfather   . Allergic rhinitis Neg Hx   . Angioedema Neg Hx   . Eczema Neg Hx   . Immunodeficiency Neg Hx   . Urticaria Neg Hx      Social History: Shankar lives at home with his family.  They live in a townhome old.  There is wood laminate throughout the home as well as carpeting in the bedrooms.  And a heat pump for heating and central cooling.  There are no animals inside or outside of the home.  There are no dust mite covers on the bedding.  There is no tobacco exposure.  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  He has done this for the past 5 months.  He is not exposed to fumes, chemicals, or dust.   Review of Systems  Constitutional: Negative.  Negative for chills, fever, malaise/fatigue and weight loss.  HENT: Positive for  congestion. Negative for ear discharge and ear pain.        Positive for postnasal drip.  Eyes: Negative for pain, discharge and redness.  Respiratory: Positive for cough and shortness of breath. Negative for sputum production and wheezing.   Cardiovascular: Negative.  Negative for chest pain and palpitations.  Gastrointestinal: Negative for abdominal pain, heartburn, nausea and vomiting.  Skin: Positive for rash (Back). Negative for itching.  Neurological: Negative for dizziness and headaches.  Endo/Heme/Allergies: Positive for environmental allergies. Does not bruise/bleed easily.       Objective:   Blood pressure (!) 138/92, pulse 86, temperature 98.9 F (37.2 C), temperature source Temporal, resp. rate 20, height _0  (1.88 m), weight (!) 349 lb 10.4 oz (158.6 kg), SpO2 97 %. Body mass index is 44.89 kg/m.   Physical Exam:   Physical Exam Constitutional:      Appearance: He is well-developed.     Comments: Very friendly.  HENT:     Head: Normocephalic and atraumatic.     Right Ear: Tympanic membrane, ear canal and external ear  normal. No drainage, swelling or tenderness. Tympanic membrane is not injected, scarred, erythematous, retracted or bulging.     Left Ear: Tympanic membrane, ear canal and external ear normal. No drainage, swelling or tenderness. Tympanic membrane is not injected, scarred, erythematous, retracted or bulging.     Nose: No nasal deformity, septal deviation, mucosal edema, rhinorrhea or epistaxis.     Right Turbinates: Enlarged and swollen.     Left Turbinates: Enlarged and swollen.     Right Sinus: No maxillary sinus tenderness or frontal sinus tenderness.     Left Sinus: No maxillary sinus tenderness or frontal sinus tenderness.     Comments: No nasal polyps.    Mouth/Throat:     Mouth: Oropharynx is clear and moist. Mucous membranes are not pale and not dry.     Pharynx: Uvula midline.     Comments: Cobblestoning of the posterior oropharynx. Eyes:     General:        Right eye: No discharge.        Left eye: No discharge.     Extraocular Movements: EOM normal.     Conjunctiva/sclera: Conjunctivae normal.     Right eye: Right conjunctiva is not injected. No chemosis.    Left eye: Left conjunctiva is not injected. No chemosis.    Pupils: Pupils are equal, round, and reactive to light.  Cardiovascular:     Rate and Rhythm: Normal rate and regular rhythm.     Heart sounds: Normal heart sounds.  Pulmonary:     Effort: Pulmonary effort is normal. No tachypnea, accessory muscle usage or respiratory distress.     Breath sounds: Normal breath sounds. No wheezing, rhonchi or rales.     Comments: Moving air well in all lung fields.  No increased work of breathing. Chest:     Chest wall: No tenderness.  Abdominal:     Tenderness: There is no abdominal tenderness. There is no guarding or rebound.  Lymphadenopathy:     Head:     Right side of head: No submandibular, tonsillar or occipital adenopathy.     Left side of head: No submandibular, tonsillar or occipital adenopathy.     Cervical: No  cervical adenopathy.  Skin:    General: Skin is warm.     Capillary Refill: Capillary refill takes less than 2 seconds.     Coloration: Skin is not pale.     Findings:  No abrasion, erythema, petechiae or rash. Rash is not papular, urticarial or vesicular.     Comments: Some dry patches on his back.  Otherwise normal.  Neurological:     Mental Status: He is alert.  Psychiatric:        Mood and Affect: Mood and affect normal.        Behavior: Behavior is cooperative.      Diagnostic studies:    Spirometry: results abnormal (FEV1: 2.49/63%, FVC: 3.27/67%, FEV1/FVC: 76%).    Spirometry consistent with possible restrictive disease. Xopenex four puffs via MDI treatment given in clinic with no improvement.  Allergy Studies:     Airborne Adult Perc - 11/26/20 1505    Time Antigen Placed 1505    Allergen Manufacturer Lavella Hammock    Location Back    Number of Test 59    1. Control-Buffer 50% Glycerol Negative    2. Control-Histamine 1 mg/ml 3+    3. Albumin saline Negative    4. Gorman Negative    5. [place] Negative    6. Johnson Negative    7. Niagara Blue Negative    8. Meadow Fescue Negative    9. Perennial Rye Negative    10. Sweet Vernal Negative    11. Timothy Negative    12. Cocklebur Negative    13. Burweed Marshelder Negative    14. Ragweed, short Negative    15. Ragweed, Giant Negative    16. Plantain,  English Negative    17. Lamb's Quarters Negative    18. Sheep Sorrell Negative    19. Rough Pigweed Negative    20. Marsh Elder, Rough Negative    21. Mugwort, Common Negative    22. Ash mix Negative    23. Birch mix Negative    24. Beech American Negative    25. Box, Elder Negative    26. Cedar, red Negative    27. Cottonwood, [place] Negative    28. Elm mix Negative    29. Hickory Negative    30. Maple mix Negative    31. Oak, [place] mix Negative    32. Pecan Pollen Negative    33. Pine mix Negative    34. Sycamore Eastern Negative    35. La [place], Black  Pollen Negative    36. Alternaria alternata Negative    37. Cladosporium Herbarum Negative    38. Aspergillus mix Negative    39. Penicillium mix Negative    40. Bipolaris sorokiniana (Helminthosporium) Negative    41. Drechslera spicifera (Curvularia) Negative    42. Mucor plumbeus Negative    43. Fusarium moniliforme Negative    44. Aureobasidium pullulans (pullulara) Negative    45. Rhizopus oryzae Negative    46. Botrytis cinera Negative    47. Epicoccum nigrum Negative    48. Phoma betae Negative    49. Candida Albicans Negative    50. Trichophyton mentagrophytes Negative    51. Mite, D Farinae  5,000 AU/ml Negative    52. Mite, D Pteronyssinus  5,000 AU/ml Negative    53. Cat Hair 10,000 BAU/ml Negative    54.  Dog Epithelia Negative    55. Mixed Feathers Negative    56. Horse Epithelia Negative    57. Cockroach, German Negative    58. Mouse Negative    59. Tobacco Leaf Negative          Food Perc - 11/26/20 1506      Test Information   Time Antigen Placed 2876  Allergen Manufacturer [place]    Location Back    Number of allergen test 1      Food   6. Egg White, chicken Negative          Intradermal - 11/26/20 1550    Time Antigen Placed 1551    Allergen Manufacturer Lavella Hammock    Location Arm    Number of Test 15    Control Negative    Guatemala Negative    Johnson 1+    7 Grass Negative    Ragweed mix 1+    Weed mix Negative    Tree mix Negative    Mold 1 Negative    Mold 2 Negative    Mold 3 Negative    Mold 4 Negative    Cat Negative    Dog Negative    Cockroach Negative    Mite mix 1+    Other Omitted           Allergy testing results were read and interpreted by myself, documented by clinical staff.         Salvatore Marvel, MD [facility]

## 2020-11-27 ENCOUNTER — Encounter: Payer: Self-pay | Admitting: Allergy & Immunology

## 2020-11-27 DIAGNOSIS — J4530 Mild persistent asthma, uncomplicated: Secondary | ICD-10-CM | POA: Insufficient documentation

## 2020-11-27 DIAGNOSIS — T7800XA Anaphylactic reaction due to unspecified food, initial encounter: Secondary | ICD-10-CM | POA: Insufficient documentation

## 2020-11-27 DIAGNOSIS — L2089 Other atopic dermatitis: Secondary | ICD-10-CM | POA: Insufficient documentation

## 2020-11-27 DIAGNOSIS — J302 Other seasonal allergic rhinitis: Secondary | ICD-10-CM | POA: Insufficient documentation

## 2020-11-27 MED ORDER — TRIAMCINOLONE ACETONIDE 0.1 % EX OINT
1.0000 | TOPICAL_OINTMENT | Freq: Two times a day (BID) | CUTANEOUS | 3 refills | Status: AC
Start: 2020-11-27 — End: ?

## 2020-12-05 ENCOUNTER — Telehealth: Payer: Self-pay

## 2020-12-05 NOTE — Telephone Encounter (Signed)
Pa submitted thru cover my meds, being he has not tried anything if they deny the pa insurances covers arnuity or lfovent

## 2020-12-10 NOTE — Telephone Encounter (Signed)
Prior auth for Alvesco was denied. Patient's insurance is requiring patient try/fail: Arnuity Ellipta, Flovent Diskus, Pulmicort Flexhaler, Qvar or Asmanex HFA.

## 2020-12-12 ENCOUNTER — Other Ambulatory Visit: Payer: Self-pay

## 2020-12-12 MED ORDER — FLOVENT HFA 110 MCG/ACT IN AERO: 2.0000 | INHALATION_SPRAY | Freq: Two times a day (BID) | RESPIRATORY_TRACT | 4 refills | Status: AC

## 2020-12-12 NOTE — Telephone Encounter (Signed)
Flovent 110 mcg x 1 with 5 refills sent to [facility]. Patient already informed.

## 2020-12-12 NOTE — Telephone Encounter (Signed)
I called the patient to inform him that we will be sending in a Flovent inhaler 2 puffs bid and verify which pharmacy to send it into.I left it pending until I am able to verify his preferred pharmacy. I also left a message to call us back.

## 2020-12-12 NOTE — Telephone Encounter (Signed)
Patient called back and his pharmacy is [facility] on [address] and [address]. He said he no longer uses [facility] pharmacy and that can be removed from his chart.

## 2020-12-12 NOTE — Telephone Encounter (Signed)
That's fine - let's do Flovent two puffs BID.  Malachi Bonds, MD [facility]

## 2020-12-12 NOTE — Addendum Note (Signed)
Addended by: Janie Morning on: 12/12/2020 02:37 PM   Modules accepted: Orders

## 2020-12-23 NOTE — Patient Instructions (Incomplete)
1. 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 In case of an allergic reaction, give Benadryl *** {Blank single:19197::"teaspoonful","teaspoonfuls","capsules"} every {blank single:19197::"4","6"} hours, and if life-threatening symptoms occur, inject with {Blank single:19197::"EpiPen 0.3 mg","EpiPen 0.15 mg","AuviQ 0.3 mg","AuviQ 0.15 mg","AuviQ 0.10 mg"}.  2. Mild persistent asthma, uncomplicated - Daily controller medication(s): Alvesco 160mg two puffs twice daily with spacer - Prior to physical activity: albuterol 2 puffs 10-15 minutes before physical activity. - Rescue medications: albuterol 4 puffs every 4-6 hours as needed - Changes during respiratory infections or worsening symptoms: Increase Alvesco 160mg to 4 puffs twice daily for TWO WEEKS. - Asthma control goals:  * Full participation in all desired activities (may need albuterol before activity) * Albuterol use two time or less a week on average (not counting use with activity) * Cough interfering with sleep two time or less a month * Oral steroids no more than once a year * No hospitalizations  3. Seasonal and perennial allergic rhinitis (grasses, ragweed, and dust mites) -Continue Zyrtec (cetirizine) 10mg tablet once daily and Flonase (fluticasone) two sprays per nostril daily - You can use an extra dose of the antihistamine, if needed, for breakthrough symptoms.  - Consider nasal saline rinses 1-2 times daily to remove allergens from the nasal cavities as well as help with mucous clearance (this is especially helpful to do before the nasal sprays are given) - Consider allergy shots as a means of long-term control. - Allergy shots "re-train" and "reset" the immune system to ignore environmental allergens and decrease the resulting immune response to those allergens (sneezing, itchy watery eyes, runny nose, nasal congestion, etc).    - Allergy shots improve symptoms in 75-85% of patients.  - We can discuss more at the next appointment  if the medications are not working for you.  4. Flexural atopic dermatitis - Continue triamcinolone compounded with Eucerin twice daily as needed to red itchy areas.   Please let us know if this treatment plan is not working well for you. Schedule a follow up appointment in         

## 2020-12-24 ENCOUNTER — Emergency Department (HOSPITAL_BASED_OUTPATIENT_CLINIC_OR_DEPARTMENT_OTHER)
Admission: EM | Admit: 2020-12-24 | Discharge: 2020-12-25 | Disposition: A | Discharge status: Home / Self Care | Payer: 59 | Attending: Emergency Medicine | Admitting: Emergency Medicine

## 2020-12-24 ENCOUNTER — Other Ambulatory Visit: Payer: Self-pay

## 2020-12-24 ENCOUNTER — Ambulatory Visit: Payer: 59 | Admitting: Family

## 2020-12-24 DIAGNOSIS — Z20822 Contact with and (suspected) exposure to covid-19: Secondary | ICD-10-CM | POA: Diagnosis not present

## 2020-12-24 DIAGNOSIS — E119 Type 2 diabetes mellitus without complications: Secondary | ICD-10-CM | POA: Insufficient documentation

## 2020-12-24 DIAGNOSIS — R197 Diarrhea, unspecified: Secondary | ICD-10-CM | POA: Diagnosis not present

## 2020-12-24 DIAGNOSIS — J4530 Mild persistent asthma, uncomplicated: Secondary | ICD-10-CM | POA: Insufficient documentation

## 2020-12-24 DIAGNOSIS — R109 Unspecified abdominal pain: Secondary | ICD-10-CM | POA: Insufficient documentation

## 2020-12-24 DIAGNOSIS — Z7951 Long term (current) use of inhaled steroids: Secondary | ICD-10-CM | POA: Insufficient documentation

## 2020-12-24 DIAGNOSIS — Z7984 Long term (current) use of oral hypoglycemic drugs: Secondary | ICD-10-CM | POA: Diagnosis not present

## 2020-12-24 DIAGNOSIS — I10 Essential (primary) hypertension: Secondary | ICD-10-CM | POA: Diagnosis not present

## 2020-12-24 DIAGNOSIS — R112 Nausea with vomiting, unspecified: Secondary | ICD-10-CM | POA: Diagnosis present

## 2020-12-24 DIAGNOSIS — Z79899 Other long term (current) drug therapy: Secondary | ICD-10-CM | POA: Insufficient documentation

## 2020-12-24 LAB — CBC WITH DIFFERENTIAL/PLATELET
Abs Immature Granulocytes: 0.03 10*3/uL (ref 0.00–0.07)
Basophils Absolute: 0 10*3/uL (ref 0.0–0.1)
Basophils Relative: 0 %
Eosinophils Absolute: 0.1 10*3/uL (ref 0.0–0.5)
Eosinophils Relative: 1 %
HCT: 45.5 % (ref 39.0–52.0)
Hemoglobin: 14.6 g/dL (ref 13.0–17.0)
Immature Granulocytes: 0 %
Lymphocytes Relative: 12 %
Lymphs Abs: 1.2 10*3/uL (ref 0.7–4.0)
MCH: 30.2 pg (ref 26.0–34.0)
MCHC: 32.1 g/dL (ref 30.0–36.0)
MCV: 94 fL (ref 80.0–100.0)
Monocytes Absolute: 0.5 10*3/uL (ref 0.1–1.0)
Monocytes Relative: 5 %
Neutro Abs: 8.2 10*3/uL — ABNORMAL HIGH (ref 1.7–7.7)
Neutrophils Relative %: 82 %
Platelets: 176 10*3/uL (ref 150–400)
RBC: 4.84 MIL/uL (ref 4.22–5.81)
RDW: 13.2 % (ref 11.5–15.5)
WBC: 10.1 10*3/uL (ref 4.0–10.5)
nRBC: 0 % (ref 0.0–0.2)

## 2020-12-24 MED ORDER — SODIUM CHLORIDE 0.9 % IV BOLUS
1000.0000 mL | Freq: Once | INTRAVENOUS | Status: AC
  Administered 2020-12-24: 1000 mL via INTRAVENOUS

## 2020-12-24 MED ORDER — ACETAMINOPHEN 500 MG PO TABS
1000.0000 mg | ORAL_TABLET | Freq: Once | ORAL | Status: AC
  Administered 2020-12-24: 1000 mg via ORAL
  Filled 2020-12-24: qty 2

## 2020-12-24 MED ORDER — ONDANSETRON HCL 4 MG/2ML IJ SOLN
4.0000 mg | Freq: Once | INTRAMUSCULAR | Status: AC
  Administered 2020-12-24: 4 mg via INTRAVENOUS
  Filled 2020-12-24: qty 2

## 2020-12-24 MED ORDER — MORPHINE SULFATE (PF) 2 MG/ML IV SOLN
2.0000 mg | Freq: Once | INTRAVENOUS | Status: AC
  Administered 2020-12-24: 2 mg via INTRAVENOUS
  Filled 2020-12-24: qty 1

## 2020-12-24 NOTE — ED Provider Notes (Signed)
[facility]CENTER [facility] EMERGENCY DEPT Provider Note   CSN: [id number] Arrival date & time: 12/24/20  2317     History Chief Complaint  Patient presents with  . Chills  . Nausea  . Diarrhea    Jared Hudson is a 45 y.o. male.  45 yo M with a chief complaints of nausea vomiting and diarrhea.  This started yesterday evening and has pretty much resolved.  Is developed some waves of nausea today but no vomiting.  None bilious or bloody emesis nonbloody or dark diarrhea.  Had some subjective feelings of warmth but denies fevers or chills.  Denies urinary symptoms.  His brother had E. coli bacteremia though has not had contact with his brother.  Denies suspicious food intake.  Has been able to eat and drink little bit at home today.  The history is provided by the patient.  Diarrhea Associated symptoms: abdominal pain and vomiting   Associated symptoms: no arthralgias, no chills, no fever, no headaches and no myalgias   Illness Severity:  Moderate Onset quality:  Gradual Duration:  1 day Timing:  Constant Progression:  Resolved Chronicity:  New Associated symptoms: abdominal pain, diarrhea, nausea and vomiting   Associated symptoms: no chest pain, no congestion, no fever, no headaches, no myalgias, no rash and no shortness of breath        Past Medical History:  Diagnosis Date  . Asthma   . Diabetes (HCC)   . GERD (gastroesophageal reflux disease)   . Hypertension   . Hypertension   . Sleep apnea     Patient Active Problem List   Diagnosis Date Noted  . 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  . Seasonal and perennial allergic rhinitis 11/27/2020  . Mild persistent asthma, uncomplicated 11/27/2020  . Flexural atopic dermatitis 11/27/2020    Past Surgical History:  Procedure Laterality Date  . ADENOIDECTOMY    . TONSILLECTOMY         Family History  Problem Relation Age of Onset  . Asthma Paternal Grandfather   . Allergic rhinitis Neg  Hx   . Angioedema Neg Hx   . Eczema Neg Hx   . Immunodeficiency Neg Hx   . Urticaria Neg Hx     Social History   Tobacco Use  . Smoking status: Never Smoker  . Smokeless tobacco: Never Used  Vaping Use  . Vaping Use: Never used  Substance Use Topics  . Alcohol use: No  . Drug use: No    Home Medications Prior to Admission medications   Medication Sig Start Date End Date Taking? Authorizing Provider  ondansetron (ZOFRAN ODT) 4 MG disintegrating tablet 4mg  ODT q4 hours prn nausea/vomit 12/25/20  Yes 12/27/20, Rigo Letts, DO  amLODipine (NORVASC) 5 MG tablet Take by mouth. 05/25/20 05/25/21  [provider]  ciclesonide (ALVESCO) 160 MCG/ACT inhaler Inhale 2 puffs into the lungs 2 (two) times daily. 11/26/20   11/28/20, MD  fluticasone [facility]) 50 MCG/ACT nasal spray 2 sprays into each nostril daily. 03/08/20 03/08/21  [provider]  fluticasone (FLOVENT HFA) 110 MCG/ACT inhaler Inhale 2 puffs into the lungs in the morning and at bedtime. Rinse, gargle and spit out after use. 12/12/20   02/11/21, MD  hydrochlorothiazide (HYDRODIURIL) 25 MG tablet Take by mouth. 12/30/19 12/29/20  [provider]  ibuprofen (ADVIL) 600 MG tablet TAKE 1 TABLET BY MOUTH EVERY 6 HOURS FOR 3 DAYS 11/01/20   [provider]  metFORMIN (GLUCOPHAGE) 500 MG tablet  Take by mouth. 03/18/19 11/26/20  [provider]  omeprazole (PRILOSEC) 40 MG capsule Take 1 capsule (40 mg total) by mouth daily. 03/10/20   Molpus, John, MD  triamcinolone ointment (KENALOG) 0.1 % Apply 1 application topically 2 (two) times daily. 11/27/20   Alfonse Spruce, MD  pantoprazole (PROTONIX) 40 MG tablet Take 1 tablet (40 mg total) by mouth daily. 11/10/17 03/10/20  Ward, Layla Maw, DO    Allergies    Eggs or egg-derived products and Other  Review of Systems   Review of Systems  Constitutional: Negative for chills and fever.  HENT: Negative for congestion and facial swelling.    Eyes: Negative for discharge and visual disturbance.  Respiratory: Negative for shortness of breath.   Cardiovascular: Negative for chest pain and palpitations.  Gastrointestinal: Positive for abdominal pain, diarrhea, nausea and vomiting.  Musculoskeletal: Negative for arthralgias and myalgias.  Skin: Negative for color change and rash.  Neurological: Negative for tremors, syncope and headaches.  Psychiatric/Behavioral: Negative for confusion and dysphoric mood.    Physical Exam Updated Vital Signs BP 121/73 (BP Location: Right Arm)   Pulse (!) 101   Temp 100 F (37.8 C) (Oral)   Resp 16   Ht 6\' 3"  (1.905 m)   Wt (!) 154.2 kg   SpO2 95%   BMI 42.50 kg/m   Physical Exam Vitals and nursing note reviewed.  Constitutional:      Appearance: He is well-developed.     Comments: BMI 42  HENT:     Head: Normocephalic and atraumatic.  Eyes:     Pupils: Pupils are equal, round, and reactive to light.  Neck:     Vascular: No JVD.  Cardiovascular:     Rate and Rhythm: Normal rate and regular rhythm.     Heart sounds: No murmur heard. No friction rub. No gallop.   Pulmonary:     Effort: No respiratory distress.     Breath sounds: No wheezing.  Abdominal:     General: There is no distension.     Tenderness: There is no abdominal tenderness. There is no guarding or rebound.     Comments: No obvious tenderness on abdominal exam  Musculoskeletal:        General: Normal range of motion.     Cervical back: Normal range of motion and neck supple.  Skin:    Coloration: Skin is not pale.     Findings: No rash.  Neurological:     Mental Status: He is alert and oriented to person, place, and time.  Psychiatric:        Behavior: Behavior normal.     ED Results / Procedures / Treatments   Labs (all labs ordered are listed, but only abnormal results are displayed) Labs Reviewed  CBC WITH DIFFERENTIAL/PLATELET - Abnormal; Notable for the following components:      Result Value    Neutro Abs 8.2 (*)    All other components within normal limits  COMPREHENSIVE METABOLIC PANEL - Abnormal; Notable for the following components:   Potassium 3.4 (*)    Glucose, Bld 181 (*)    Calcium 8.1 (*)    All other components within normal limits  LIPASE, BLOOD - Abnormal; Notable for the following components:   Lipase <10 (*)    All other components within normal limits  SARS CORONAVIRUS 2 (TAT 6-24 HRS)    EKG None  Radiology No results found.  Procedures Procedures   Medications Ordered in ED Medications  sodium chloride 0.9 % bolus 1,000 mL (1,000 mLs Intravenous New Bag/Given 12/24/20 2352)  ondansetron (ZOFRAN) injection 4 mg (4 mg Intravenous Given 12/24/20 2347)  morphine 2 MG/ML injection 2 mg (2 mg Intravenous Given 12/24/20 2347)  acetaminophen (TYLENOL) tablet 1,000 mg (1,000 mg Oral Given 12/24/20 2351)    ED Course  I have reviewed the triage vital signs and the nursing notes.  Pertinent labs & imaging results that were available during my care of the patient were reviewed by me and considered in my medical decision making (see chart for details).    MDM Rules/Calculators/A&P                          45 yo M with a chief complaints of nausea vomiting and diarrhea.  This occurred yesterday and is pretty much resolved.  Able to eat and drink today.  He is well-appearing and nontoxic.  Borderline febrile with mild tachycardia.  Will give a bolus of IV fluids lab work reassess.  Patient feeling better on reassessment.  Lab work has returned without significant leukocytosis.  No LFT elevation lipase is normal.  Most likely viral syndrome.  He is complaining of a very mild cough.  Could be the coronavirus.  We will send off a test.  Tachycardia resolved with fluids and tylenol.  D/c home.   TOA MIA was evaluated in Emergency Department on 12/25/2020 for the symptoms described in the history of present illness. He/she was evaluated in the context of the  global COVID-19 pandemic, which necessitated consideration that the patient might be at risk for infection with the SARS-CoV-2 virus that causes COVID-19. Institutional protocols and algorithms that pertain to the evaluation of patients at risk for COVID-19 are in a state of rapid change based on information released by regulatory bodies including the CDC and federal and state organizations. These policies and algorithms were followed during the patient's care in the ED.  1:01 AM:  I have discussed the diagnosis/risks/treatment options with the patient and believe the pt to be eligible for discharge home to follow-up with PCP. We also discussed returning to the ED immediately if new or worsening sx occur. We discussed the sx which are most concerning (e.g., sudden worsening pain, fever, inability to tolerate by mouth) that necessitate immediate return. Medications administered to the patient during their visit and any new prescriptions provided to the patient are listed below.  Medications given during this visit Medications  sodium chloride 0.9 % bolus 1,000 mL (1,000 mLs Intravenous New Bag/Given 12/24/20 2352)  ondansetron (ZOFRAN) injection 4 mg (4 mg Intravenous Given 12/24/20 2347)  morphine 2 MG/ML injection 2 mg (2 mg Intravenous Given 12/24/20 2347)  acetaminophen (TYLENOL) tablet 1,000 mg (1,000 mg Oral Given 12/24/20 2351)     The patient appears reasonably screen and/or stabilized for discharge and I doubt any other medical condition or other [facility] [employer] requiring further screening, evaluation, or treatment in the ED at this time prior to discharge.   Final Clinical Impression(s) / ED Diagnoses Final diagnoses:  Nausea vomiting and diarrhea    Rx / DC Orders ED Discharge Orders         Ordered    ondansetron (ZOFRAN ODT) 4 MG disintegrating tablet        12/25/20 0100           Melene Plan, DO 12/25/20 0101

## 2020-12-24 NOTE — ED Triage Notes (Addendum)
Pt reports chills, 4-5 episodes of diarrhea and 4 episodes of vomiting since yesterday. Pt reports wife was just sick. Pt wants Ct scan. States" I feel like I have fluid behind my eyes. I fell and hit my head in 2005 and since then I haven't felt right". Denies dizziness. Ambulatory to room 12.

## 2020-12-25 LAB — COMPREHENSIVE METABOLIC PANEL
ALT: 22 U/L (ref 0–44)
AST: 26 U/L (ref 15–41)
Albumin: 3.7 g/dL (ref 3.5–5.0)
Alkaline Phosphatase: 49 U/L (ref 38–126)
Anion gap: 7 (ref 5–15)
BUN: 13 mg/dL (ref 6–20)
CO2: 30 mmol/L (ref 22–32)
Calcium: 8.1 mg/dL — ABNORMAL LOW (ref 8.9–10.3)
Chloride: 102 mmol/L (ref 98–111)
Creatinine, Ser: 1.19 mg/dL (ref 0.61–1.24)
GFR, Estimated: >60 mL/min (ref >60)
Glucose, Bld: 181 mg/dL — ABNORMAL HIGH (ref 70–99)
Potassium: 3.4 mmol/L — ABNORMAL LOW (ref 3.5–5.1)
Sodium: 139 mmol/L (ref 135–145)
Total Bilirubin: 0.6 mg/dL (ref 0.3–1.2)
Total Protein: 7.2 g/dL (ref 6.5–8.1)

## 2020-12-25 LAB — SARS CORONAVIRUS 2 (TAT 6-24 HRS): SARS Coronavirus 2: NEGATIVE

## 2020-12-25 LAB — LIPASE, BLOOD: Lipase: <10 U/L — ABNORMAL LOW (ref 11–51)

## 2020-12-25 MED ORDER — ONDANSETRON 4 MG PO TBDP: ORAL_TABLET | ORAL | 0 refills | Status: AC

## 2020-12-25 NOTE — Discharge Instructions (Signed)
Most likely her symptoms are caused by virus.  Please take the nausea medicine as needed.  He can take Imodium for diarrhea.  Return for worsening abdominal pain inability to eat or drink.

## 2020-12-25 NOTE — ED Notes (Addendum)
Pt educated on discharge paper work. Vitals taken. A&Ox4. Ambulatory with steady gait. NAD. Pt notified to car ride. Pt states" girl my ride is outside, I anit broke".

## 2020-12-25 NOTE — ED Notes (Signed)
Pt provided with warm blankets and placed on 2L [place] per request.

## 2020-12-25 NOTE — ED Notes (Addendum)
Pt ambulatory to room 12. Vitals taken. A&Ox4. Respirations regular/unlabored. NAD. Patient able to speak full sentences. Stretcher low, wheels locked, call bell within reach. Connected to BP and pulse ox.

## 2020-12-28 NOTE — Patient Instructions (Incomplete)
1. 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 In case of an allergic reaction, give Benadryl *** {Blank single:19197::"teaspoonful","teaspoonfuls","capsules"} every {blank single:19197::"4","6"} hours, and if life-threatening symptoms occur, inject with {Blank single:19197::"EpiPen 0.3 mg","EpiPen 0.15 mg","AuviQ 0.3 mg","AuviQ 0.15 mg","AuviQ 0.10 mg"}.  2. Mild persistent asthma, uncomplicated - Daily controller medication(s): Alvesco 160mg  two puffs twice daily with spacer - Prior to physical activity: albuterol 2 puffs 10-15 minutes before physical activity. - Rescue medications: albuterol 4 puffs every 4-6 hours as needed - Changes during respiratory infections or worsening symptoms: Increase Alvesco 160mg  to 4 puffs twice daily for TWO WEEKS. - Asthma control goals:  * Full participation in all desired activities (may need albuterol before activity) * Albuterol use two time or less a week on average (not counting use with activity) * Cough interfering with sleep two time or less a month * Oral steroids no more than once a year * No hospitalizations  3. Seasonal and perennial allergic rhinitis (grasses, ragweed, and dust mites) -Continue Zyrtec (cetirizine) 10mg  tablet once daily and Flonase (fluticasone) two sprays per nostril daily - You can use an extra dose of the antihistamine, if needed, for breakthrough symptoms.  - Consider nasal saline rinses 1-2 times daily to remove allergens from the nasal cavities as well as help with mucous clearance (this is especially helpful to do before the nasal sprays are given) - Consider allergy shots as a means of long-term control. - Allergy shots "re-train" and "reset" the immune system to ignore environmental allergens and decrease the resulting immune response to those allergens (sneezing, itchy watery eyes, runny nose, nasal congestion, etc).    - Allergy shots improve symptoms in 75-85% of patients.  - We can discuss more at the next appointment  if the medications are not working for you.  4. Flexural atopic dermatitis - Continue triamcinolone compounded with Eucerin twice daily as needed to red itchy areas.   Please let know if this treatment plan is not working well for you. Schedule a follow up appointment in

## 2020-12-31 ENCOUNTER — Ambulatory Visit: Payer: 59 | Admitting: Family

## 2021-01-01 NOTE — Patient Instructions (Incomplete)
1. 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 In case of an allergic reaction, give Benadryl *** {Blank single:19197::"teaspoonful","teaspoonfuls","capsules"} every {blank single:19197::"4","6"} hours, and if life-threatening symptoms occur, inject with {Blank single:19197::"EpiPen 0.3 mg","EpiPen 0.15 mg","AuviQ 0.3 mg","AuviQ 0.15 mg","AuviQ 0.10 mg"}.  2. Mild persistent asthma, uncomplicated - Daily controller medication(s): Flovent 110 mcg two puffs twice daily with spacer - Prior to physical activity: albuterol 2 puffs 10-15 minutes before physical activity. - Rescue medications: albuterol 4 puffs every 4-6 hours as needed - Changes during respiratory infections or worsening symptoms: Increase Flovent 110 mcg to 4 puffs twice daily for TWO WEEKS. - Asthma control goals:  * Full participation in all desired activities (may need albuterol before activity) * Albuterol use two time or less a week on average (not counting use with activity) * Cough interfering with sleep two time or less a month * Oral steroids no more than once a year * No hospitalizations  3. Seasonal and perennial allergic rhinitis (grasses, ragweed, and dust mites) -Continue Zyrtec (cetirizine) 10mg  tablet once daily and Flonase (fluticasone) two sprays per nostril daily - You can use an extra dose of the antihistamine, if needed, for breakthrough symptoms.  - Consider nasal saline rinses 1-2 times daily to remove allergens from the nasal cavities as well as help with mucous clearance (this is especially helpful to do before the nasal sprays are given) - Consider allergy shots as a means of long-term control. - Allergy shots "re-train" and "reset" the immune system to ignore environmental allergens and decrease the resulting immune response to those allergens (sneezing, itchy watery eyes, runny nose, nasal congestion, etc).    - Allergy shots improve symptoms in 75-85% of patients.  - We can discuss more at the next  appointment if the medications are not working for you.  4. Flexural atopic dermatitis - Continue triamcinolone compounded with Eucerin twice daily as needed to red itchy areas.   Please let Jared Hudson know if this treatment plan is not working well for you. Schedule a follow up appointment in

## 2021-01-07 ENCOUNTER — Ambulatory Visit: Payer: 59 | Admitting: Family

## 2021-01-07 DIAGNOSIS — J309 Allergic rhinitis, unspecified: Secondary | ICD-10-CM

## 2021-04-07 ENCOUNTER — Emergency Department (HOSPITAL_BASED_OUTPATIENT_CLINIC_OR_DEPARTMENT_OTHER)
Admission: EM | Admit: 2021-04-07 | Discharge: 2021-04-07 | Disposition: A | Discharge status: Home / Self Care | Payer: 59 | Attending: Emergency Medicine | Admitting: Emergency Medicine

## 2021-04-07 ENCOUNTER — Other Ambulatory Visit: Payer: Self-pay

## 2021-04-07 ENCOUNTER — Encounter (HOSPITAL_BASED_OUTPATIENT_CLINIC_OR_DEPARTMENT_OTHER): Payer: Self-pay | Admitting: *Deleted

## 2021-04-07 DIAGNOSIS — E119 Type 2 diabetes mellitus without complications: Secondary | ICD-10-CM | POA: Diagnosis not present

## 2021-04-07 DIAGNOSIS — K1329 Other disturbances of oral epithelium, including tongue: Secondary | ICD-10-CM | POA: Diagnosis present

## 2021-04-07 DIAGNOSIS — B370 Candidal stomatitis: Secondary | ICD-10-CM | POA: Diagnosis not present

## 2021-04-07 DIAGNOSIS — J4530 Mild persistent asthma, uncomplicated: Secondary | ICD-10-CM | POA: Insufficient documentation

## 2021-04-07 DIAGNOSIS — I10 Essential (primary) hypertension: Secondary | ICD-10-CM | POA: Insufficient documentation

## 2021-04-07 DIAGNOSIS — Z7984 Long term (current) use of oral hypoglycemic drugs: Secondary | ICD-10-CM | POA: Insufficient documentation

## 2021-04-07 DIAGNOSIS — Z79899 Other long term (current) drug therapy: Secondary | ICD-10-CM | POA: Diagnosis not present

## 2021-04-07 LAB — CBG MONITORING, ED: Glucose-Capillary: 141 mg/dL — ABNORMAL HIGH (ref 70–99)

## 2021-04-07 MED ORDER — CETIRIZINE HCL 10 MG PO TABS: 10.0000 mg | ORAL_TABLET | Freq: Every day | ORAL | 0 refills | Status: AC

## 2021-04-07 MED ORDER — NYSTATIN 100000 UNIT/ML MT SUSP: 5.0000 mL | Freq: Four times a day (QID) | OROMUCOSAL | 0 refills | Status: AC

## 2021-04-07 NOTE — ED Provider Notes (Signed)
[facility] EMERGENCY DEPT Provider Note   CSN: [id number] Arrival date & time: 04/07/21  1847     History Chief Complaint  Patient presents with   Other    Tongue issues    Jared Hudson is a 45 y.o. male.  HPI     45 year old male comes in a chief complaint of tongue issues.  Patient has history of diabetes, hypertension.  He reports that he has noticed some white plaques over his tongue.  He is also complaining of some sinus congestion.  Patient denies any history of immunocompromising conditions or taking immunosuppressive medications.  He does take steroid nasal spray for allergies.  No fevers, chills.  He does think that the sinuses are more irritated than usual.  He took amoxicillin yesterday and is wondering if he needs more.  The amoxicillin was leftover from prior use. nO N/V/F/C.  Patient denies any high risk sexual behavior, substance abuse.   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   Seasonal and perennial allergic rhinitis 11/27/2020   Mild persistent asthma, uncomplicated 11/27/2020   Flexural atopic dermatitis 11/27/2020    Past Surgical History:  Procedure Laterality Date   ADENOIDECTOMY     TONSILLECTOMY         Family History  Problem Relation Age of Onset   Asthma Paternal Grandfather    Allergic rhinitis Neg Hx    Angioedema Neg Hx    Eczema Neg Hx    Immunodeficiency Neg Hx    Urticaria Neg Hx     Social History   Tobacco Use   Smoking status: Never   Smokeless tobacco: Never  Vaping Use   Vaping Use: Never used  Substance Use Topics   Alcohol use: No   Drug use: No    Home Medications Prior to Admission medications   Medication Sig Start Date End Date Taking? Authorizing Provider   cetirizine (ZYRTEC) 10 MG tablet Take 1 tablet (10 mg total) by mouth daily. 04/07/21  Yes Derwood Kaplan, MD  nystatin (MYCOSTATIN) 100000 UNIT/ML suspension Take 5 mLs (500,000 Units total) by mouth 4 (four) times daily for 7 days. 04/07/21 04/14/21 Yes Namiah Dunnavant, MD  amLODipine (NORVASC) 5 MG tablet Take by mouth. 05/25/20 05/25/21  [provider]  ciclesonide (ALVESCO) 160 MCG/ACT inhaler Inhale 2 puffs into the lungs 2 (two) times daily. 11/26/20   Alfonse Spruce, MD  fluticasone [facility]) 50 MCG/ACT nasal spray 2 sprays into each nostril daily. 03/08/20 03/08/21  [provider]  fluticasone (FLOVENT HFA) 110 MCG/ACT inhaler Inhale 2 puffs into the lungs in the morning and at bedtime. Rinse, gargle and spit out after use. 12/12/20   Alfonse Spruce, MD  hydrochlorothiazide (HYDRODIURIL) 25 MG tablet Take by mouth. 12/30/19 12/29/20  [provider]  ibuprofen (ADVIL) 600 MG tablet TAKE 1 TABLET BY MOUTH EVERY 6 HOURS FOR 3 DAYS 11/01/20   [provider]  metFORMIN (GLUCOPHAGE) 500 MG tablet Take by mouth. 03/18/19 11/26/20  [provider]  omeprazole (PRILOSEC) 40 MG capsule Take 1 capsule (40 mg total) by mouth daily. 03/10/20   Molpus, John, MD  ondansetron (ZOFRAN ODT) 4 MG disintegrating tablet 4mg  ODT q4 hours prn  nausea/vomit 12/25/20   Melene Plan, DO  triamcinolone ointment (KENALOG) 0.1 % Apply 1 application topically 2 (two) times daily. 11/27/20   Alfonse Spruce, MD  pantoprazole (PROTONIX) 40 MG tablet Take 1 tablet (40 mg total) by mouth daily. 11/10/17 03/10/20  Ward, Layla Maw, DO    Allergies    Eggs or egg-derived products and Other  Review of Systems   Review of Systems  Constitutional:  Positive for activity change.  HENT:  Positive for sinus pressure and sinus pain.   Skin:  Negative for rash.  Allergic/Immunologic: Negative for immunocompromised state.   Physical Exam Updated Vital Signs BP 127/87 (BP  Location: Right Arm)   Pulse 80   Temp 99.2 F (37.3 C) (Oral)   Resp 20   Ht 6\' 3"  (1.905 m)   Wt (!) 153.8 kg   SpO2 97%   BMI 42.37 kg/m   Physical Exam Vitals and nursing note reviewed.  Constitutional:      Appearance: He is well-developed.  HENT:     Head: Atraumatic.     Mouth/Throat:     Comments: Tongue has white coating that we are able to scrape off Cardiovascular:     Rate and Rhythm: Normal rate.  Pulmonary:     Effort: Pulmonary effort is normal.  Musculoskeletal:     Cervical back: Neck supple.  Lymphadenopathy:     Cervical: No cervical adenopathy.  Skin:    General: Skin is warm.  Neurological:     Mental Status: He is alert and oriented to person, place, and time.    ED Results / Procedures / Treatments   Labs (all labs ordered are listed, but only abnormal results are displayed) Labs Reviewed  CBG MONITORING, ED - Abnormal; Notable for the following components:      Result Value   Glucose-Capillary 141 (*)    All other components within normal limits    EKG None  Radiology No results found.  Procedures Procedures   Medications Ordered in ED Medications - No data to display  ED Course  I have reviewed the triage vital signs and the nursing notes.  Pertinent labs & imaging results that were available during my care of the patient were reviewed by me and considered in my medical decision making (see chart for details).    MDM Rules/Calculators/A&P                          45 year old comes in a chief complaint of tongue lesion.  He is also concerned for sinusitis.  Clinically does not have any bacterial sinusitis.  We will give him Zyrtec for as needed relief. For the tongue coating, doubt its hairy leukoplakia. ?  Thrush.  We will give him nystatin prescription FOR IT.  Advised PCP follow-up.  Final Clinical Impression(s) / ED Diagnoses Final diagnoses:  Oral thrush    Rx / DC Orders ED Discharge Orders          Ordered     cetirizine (ZYRTEC) 10 MG tablet  Daily        04/07/21 2143    nystatin (MYCOSTATIN) 100000 UNIT/ML suspension  4 times daily        04/07/21 2143             2144, MD 04/07/21 2147

## 2021-04-07 NOTE — ED Triage Notes (Signed)
Pt c/o tongue hurting since yesterday.points to the back of his tongue. Also, concerned that his tongue looks white on the posterior aspect. Denies any injury. Denies any fevers. Denies a sore throat. Concerned he may be dehydrated. States he is a diabetic and blood sugars have been "ok" highest one 149.

## 2021-04-08 ENCOUNTER — Encounter (HOSPITAL_BASED_OUTPATIENT_CLINIC_OR_DEPARTMENT_OTHER): Payer: Self-pay

## 2021-04-08 ENCOUNTER — Emergency Department (HOSPITAL_BASED_OUTPATIENT_CLINIC_OR_DEPARTMENT_OTHER): Payer: 59

## 2021-04-08 ENCOUNTER — Emergency Department (HOSPITAL_BASED_OUTPATIENT_CLINIC_OR_DEPARTMENT_OTHER)
Admission: EM | Admit: 2021-04-08 | Discharge: 2021-04-09 | Disposition: A | Discharge status: Home / Self Care | Payer: 59 | Attending: Emergency Medicine | Admitting: Emergency Medicine

## 2021-04-08 ENCOUNTER — Other Ambulatory Visit: Payer: Self-pay

## 2021-04-08 DIAGNOSIS — E119 Type 2 diabetes mellitus without complications: Secondary | ICD-10-CM | POA: Diagnosis not present

## 2021-04-08 DIAGNOSIS — R519 Headache, unspecified: Secondary | ICD-10-CM | POA: Diagnosis not present

## 2021-04-08 DIAGNOSIS — Z79899 Other long term (current) drug therapy: Secondary | ICD-10-CM | POA: Insufficient documentation

## 2021-04-08 DIAGNOSIS — J4530 Mild persistent asthma, uncomplicated: Secondary | ICD-10-CM | POA: Insufficient documentation

## 2021-04-08 DIAGNOSIS — Z7984 Long term (current) use of oral hypoglycemic drugs: Secondary | ICD-10-CM | POA: Insufficient documentation

## 2021-04-08 DIAGNOSIS — Z7951 Long term (current) use of inhaled steroids: Secondary | ICD-10-CM | POA: Diagnosis not present

## 2021-04-08 DIAGNOSIS — I10 Essential (primary) hypertension: Secondary | ICD-10-CM | POA: Insufficient documentation

## 2021-04-08 LAB — CBC WITH DIFFERENTIAL/PLATELET
Abs Immature Granulocytes: 0.03 10*3/uL (ref 0.00–0.07)
Basophils Absolute: 0.1 10*3/uL (ref 0.0–0.1)
Basophils Relative: 1 %
Eosinophils Absolute: 0.2 10*3/uL (ref 0.0–0.5)
Eosinophils Relative: 2 %
HCT: 43.7 % (ref 39.0–52.0)
Hemoglobin: 14 g/dL (ref 13.0–17.0)
Immature Granulocytes: 0 %
Lymphocytes Relative: 23 %
Lymphs Abs: 2.4 10*3/uL (ref 0.7–4.0)
MCH: 30.2 pg (ref 26.0–34.0)
MCHC: 32 g/dL (ref 30.0–36.0)
MCV: 94.4 fL (ref 80.0–100.0)
Monocytes Absolute: 0.6 10*3/uL (ref 0.1–1.0)
Monocytes Relative: 6 %
Neutro Abs: 7.3 10*3/uL (ref 1.7–7.7)
Neutrophils Relative %: 68 %
Platelets: 186 10*3/uL (ref 150–400)
RBC: 4.63 MIL/uL (ref 4.22–5.81)
RDW: 13.1 % (ref 11.5–15.5)
WBC: 10.5 10*3/uL (ref 4.0–10.5)
nRBC: 0 % (ref 0.0–0.2)

## 2021-04-08 LAB — BASIC METABOLIC PANEL
Anion gap: 8 (ref 5–15)
BUN: 10 mg/dL (ref 6–20)
CO2: 33 mmol/L — ABNORMAL HIGH (ref 22–32)
Calcium: 9.5 mg/dL (ref 8.9–10.3)
Chloride: 101 mmol/L (ref 98–111)
Creatinine, Ser: 1.04 mg/dL (ref 0.61–1.24)
GFR, Estimated: >60 mL/min (ref >60)
Glucose, Bld: 117 mg/dL — ABNORMAL HIGH (ref 70–99)
Potassium: 3.7 mmol/L (ref 3.5–5.1)
Sodium: 142 mmol/L (ref 135–145)

## 2021-04-08 LAB — TROPONIN I (HIGH SENSITIVITY): Troponin I (High Sensitivity): 6 ng/L (ref <18)

## 2021-04-08 MED ORDER — DIPHENHYDRAMINE HCL 50 MG/ML IJ SOLN
25.0000 mg | Freq: Once | INTRAMUSCULAR | Status: AC
  Administered 2021-04-09: 25 mg via INTRAVENOUS
  Filled 2021-04-08: qty 1

## 2021-04-08 MED ORDER — KETOROLAC TROMETHAMINE 30 MG/ML IJ SOLN
30.0000 mg | Freq: Once | INTRAMUSCULAR | Status: AC
  Administered 2021-04-09: 30 mg via INTRAVENOUS
  Filled 2021-04-08: qty 1

## 2021-04-08 MED ORDER — METOCLOPRAMIDE HCL 5 MG/ML IJ SOLN
10.0000 mg | Freq: Once | INTRAMUSCULAR | Status: AC
  Administered 2021-04-09: 10 mg via INTRAVENOUS
  Filled 2021-04-08: qty 2

## 2021-04-08 MED ORDER — AMLODIPINE BESYLATE 5 MG PO TABS
5.0000 mg | ORAL_TABLET | Freq: Once | ORAL | Status: AC
  Administered 2021-04-08: 5 mg via ORAL
  Filled 2021-04-08: qty 1

## 2021-04-08 NOTE — ED Triage Notes (Signed)
Patient here POV from with HTN 1 Hour PTA.   Patient states he was checking his BP at home when it was above 170 systolic. Patient states he was checking it because he was having some Pressure in his Head along with Blurry Vision.  BIB Wheelchair, No CP, No SOB.

## 2021-04-08 NOTE — ED Provider Notes (Signed)
[facility] EMERGENCY DEPT Provider Note   CSN: [id number] Arrival date & time: 04/08/21  2102     History Chief Complaint  Patient presents with   Hypertension    Jared Hudson is a 45 y.o. male.  Patient with history of asthma, diabetes and hypertension here with elevated blood pressure and headache.  States he developed a gradual onset headache today which he believes is from a sinus infection.  Complains of pressure to his temples, frontal sinuses and face.  Headache is gradual onset.  Blood pressure was 170/100 at home. Seen in the ED yesterday for suspected thrush for which he was given nystatin. Missing his hydrochlorothiazide doses over the weekend but did take it today.  States he takes amlodipine only "as needed."  Had some blurry vision earlier but this has improved.  No focal weakness, numbness or tingling.  No difficulty speaking.  No chest pain or shortness of breath currently.  Has been having intermittent pain in his chest for several months that comes and goes lasting for few minutes at a time that is worse with palpation and worse with eating.  None of this pain currently.  No room spinning dizziness.  Some lightheadedness with position change.  Denies thunderclap onset of headache. Vision at baseline currently.  The history is provided by the patient.  Hypertension Associated symptoms include headaches. Pertinent negatives include no chest pain, no abdominal pain and no shortness of breath.      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   Seasonal and perennial allergic rhinitis 11/27/2020   Mild persistent asthma, uncomplicated 11/27/2020   Flexural atopic dermatitis 11/27/2020    Past Surgical History:   Procedure Laterality Date   ADENOIDECTOMY     TONSILLECTOMY         Family History  Problem Relation Age of Onset   Asthma Paternal Grandfather    Allergic rhinitis Neg Hx    Angioedema Neg Hx    Eczema Neg Hx    Immunodeficiency Neg Hx    Urticaria Neg Hx     Social History   Tobacco Use   Smoking status: Never   Smokeless tobacco: Never  Vaping Use   Vaping Use: Never used  Substance Use Topics   Alcohol use: No   Drug use: No    Home Medications Prior to Admission medications   Medication Sig Start Date End Date Taking? Authorizing Provider  amLODipine (NORVASC) 5 MG tablet Take by mouth. 05/25/20 05/25/21  [provider]  cetirizine (ZYRTEC) 10 MG tablet Take 1 tablet (10 mg total) by mouth daily. 04/07/21   Derwood Kaplan, MD  ciclesonide (ALVESCO) 160 MCG/ACT inhaler Inhale 2 puffs into the lungs 2 (two) times daily. 11/26/20   Alfonse Spruce, MD  fluticasone [facility]) 50 MCG/ACT nasal spray 2 sprays into each nostril daily. 03/08/20 03/08/21  [provider]  fluticasone (FLOVENT HFA) 110 MCG/ACT inhaler Inhale 2 puffs into the lungs in the morning and at bedtime. Rinse, gargle and spit out after use. 12/12/20   Alfonse Spruce, MD  hydrochlorothiazide (HYDRODIURIL) 25 MG tablet Take by mouth. 12/30/19 12/29/20  [provider]  ibuprofen (ADVIL) 600 MG tablet TAKE 1  TABLET BY MOUTH EVERY 6 HOURS FOR 3 DAYS 11/01/20   [provider]  metFORMIN (GLUCOPHAGE) 500 MG tablet Take by mouth. 03/18/19 11/26/20  [provider]  nystatin (MYCOSTATIN) 100000 UNIT/ML suspension Take 5 mLs (500,000 Units total) by mouth 4 (four) times daily for 7 days. 04/07/21 04/14/21  Derwood Kaplan, MD  omeprazole (PRILOSEC) 40 MG capsule Take 1 capsule (40 mg total) by mouth daily. 03/10/20   Molpus, John, MD  ondansetron (ZOFRAN ODT) 4 MG disintegrating tablet 4mg  ODT q4 hours prn nausea/vomit 12/25/20   12/27/20, DO  triamcinolone ointment  (KENALOG) 0.1 % Apply 1 application topically 2 (two) times daily. 11/27/20   11/29/20, MD  pantoprazole (PROTONIX) 40 MG tablet Take 1 tablet (40 mg total) by mouth daily. 11/10/17 03/10/20  Ward, 03/12/20, DO    Allergies    Eggs or egg-derived products and Other  Review of Systems   Review of Systems  Constitutional:  Negative for activity change, appetite change and fever.  HENT:  Negative for congestion and rhinorrhea.   Eyes:  Positive for visual disturbance.  Respiratory:  Negative for cough, chest tightness and shortness of breath.   Cardiovascular:  Negative for chest pain.  Gastrointestinal:  Negative for abdominal pain, nausea and vomiting.  Genitourinary:  Negative for dysuria and hematuria.  Musculoskeletal:  Negative for arthralgias and myalgias.  Skin:  Negative for wound.  Neurological:  Positive for dizziness, light-headedness and headaches.   all other systems are negative except as noted in the HPI and PMH.   Physical Exam Updated Vital Signs BP (!) 149/103   Pulse 75   Temp 98.9 F (37.2 C) (Oral)   Resp 16   Ht 6\' 3"  (1.905 m)   Wt (!) 153.8 kg   SpO2 100%   BMI 42.37 kg/m   Physical Exam Vitals and nursing note reviewed.  Constitutional:      General: He is not in acute distress.    Appearance: He is well-developed.  HENT:     Head: Normocephalic and atraumatic.     Ears:     Comments: Scarring right TM, serous fluid left TM  No frontal or maxillary sinus tenderness    Mouth/Throat:     Pharynx: No oropharyngeal exudate.  Eyes:     Conjunctiva/sclera: Conjunctivae normal.     Pupils: Pupils are equal, round, and reactive to light.  Neck:     Comments: No meningismus. Cardiovascular:     Rate and Rhythm: Normal rate and regular rhythm.     Heart sounds: Normal heart sounds. No murmur heard. Pulmonary:     Effort: Pulmonary effort is normal. No respiratory distress.     Breath sounds: Normal breath sounds.  Abdominal:      Palpations: Abdomen is soft.     Tenderness: There is no abdominal tenderness. There is no guarding or rebound.  Musculoskeletal:        General: No tenderness. Normal range of motion.     Cervical back: Normal range of motion and neck supple.  Skin:    General: Skin is warm.  Neurological:     Mental Status: He is alert and oriented to person, place, and time.     Cranial Nerves: No cranial nerve deficit.     Motor: No abnormal muscle tone.     Coordination: Coordination normal.     Comments: No ataxia on finger to nose bilaterally. No pronator drift. 5/5 strength throughout. CN 2-12 intact.Equal grip  strength. Sensation intact.   Psychiatric:        Behavior: Behavior normal.    ED Results / Procedures / Treatments   Labs (all labs ordered are listed, but only abnormal results are displayed) Labs Reviewed  BASIC METABOLIC PANEL - Abnormal; Notable for the following components:      Result Value   CO2 33 (*)    Glucose, Bld 117 (*)    All other components within normal limits  CBC WITH DIFFERENTIAL/PLATELET  TROPONIN I (HIGH SENSITIVITY)  TROPONIN I (HIGH SENSITIVITY)    EKG EKG Interpretation  Date/Time:  Monday April 08 2021 22:47:12 EDT Ventricular Rate:  76 PR Interval:  168 QRS Duration: 104 QT Interval:  382 QTC Calculation: 430 R Axis:   21 Text Interpretation: Sinus rhythm ST elev, probable normal early repol pattern No significant change was found Confirmed by Glynn Octave [phone]) on 04/08/2021 11:04:21 PM  Radiology CT Head Wo Contrast  Result Date: 04/08/2021 CLINICAL DATA:  Headache, hypertensive, head pressure with blurry vision EXAM: CT HEAD WITHOUT CONTRAST TECHNIQUE: Contiguous axial images were obtained from the base of the skull through the vertex without intravenous contrast. COMPARISON:  None. FINDINGS: Brain: No evidence of acute infarction, hemorrhage, hydrocephalus, extra-axial collection, visible mass lesion or mass effect. Vascular: Minimal  plaque in the carotid siphons. No worrisome hyperdense vessels or unexpected calcification. Skull: No calvarial fracture or suspicious osseous lesion. No scalp swelling or hematoma. Sinuses/Orbits: Paranasal sinuses and mastoid air cells are predominantly clear. Included orbital structures are unremarkable. 2 Other: None. IMPRESSION: No acute intracranial abnormality. Electronically Signed   By: Kreg Shropshire M.D.   On: 04/08/2021 23:16    Procedures Procedures   Medications Ordered in ED Medications - No data to display  ED Course  I have reviewed the triage vital signs and the nursing notes.  Pertinent labs & imaging results that were available during my care of the patient were reviewed by me and considered in my medical decision making (see chart for details).    MDM Rules/Calculators/A&P                         Patient with gradual onset headache and hypertension.  Did miss his blood pressure medication over the weekend.  Nonfocal neurological exam.  CT head is negative. Headache is gradual in onset.  Low suspicion for subarachnoid hemorrhage, meningitis, temporal arteritis.  EKG is unchanged and screening labs are reassuring.  Blood pressure has improved to 149/100.  Amlodipine given.  Patient states he only takes this "as needed"  Blood pressure has improved to 144/90.  Troponin negative x2.  Orthostatics are negative.  Headache has resolved.  Patient tolerating p.o. and ambulatory.  Headache has resolved.  Advised to restart his amlodipine and keep a record of his blood pressure at home. Follow-up with his doctor.  Return to the ED if worsening symptoms including headache, chest pain, unilateral weakness, numbness, tingling, any other concerns. Final Clinical Impression(s) / ED Diagnoses Final diagnoses:  Hypertension, unspecified type    Rx / DC Orders ED Discharge Orders     None        Sharnese Heath, Jeannett Senior, MD 04/09/21 [phone]

## 2021-04-09 LAB — TROPONIN I (HIGH SENSITIVITY): Troponin I (High Sensitivity): 6 ng/L (ref <18)

## 2021-04-09 NOTE — ED Notes (Signed)
Pt was given water and graham crackers.

## 2021-04-09 NOTE — Discharge Instructions (Addendum)
Take your blood pressure medication as prescribed including amlodipine.  Keep a record of your blood pressure for your doctor to follow-up.  Return to the ED with difficulty breathing, chest pain, headache, unilateral weakness or any other concerns.

## 2021-04-09 NOTE — ED Notes (Signed)
Pt ambulated with stand by assistants to the end of the nurses station and back with no issues.

## 2021-07-14 ENCOUNTER — Emergency Department (HOSPITAL_BASED_OUTPATIENT_CLINIC_OR_DEPARTMENT_OTHER)
Admission: EM | Admit: 2021-07-14 | Discharge: 2021-07-14 | Disposition: A | Discharge status: Home / Self Care | Payer: 59 | Attending: Emergency Medicine | Admitting: Emergency Medicine

## 2021-07-14 ENCOUNTER — Other Ambulatory Visit: Payer: Self-pay

## 2021-07-14 ENCOUNTER — Encounter (HOSPITAL_BASED_OUTPATIENT_CLINIC_OR_DEPARTMENT_OTHER): Payer: Self-pay | Admitting: Emergency Medicine

## 2021-07-14 ENCOUNTER — Emergency Department (HOSPITAL_BASED_OUTPATIENT_CLINIC_OR_DEPARTMENT_OTHER): Payer: 59

## 2021-07-14 DIAGNOSIS — J45909 Unspecified asthma, uncomplicated: Secondary | ICD-10-CM | POA: Insufficient documentation

## 2021-07-14 DIAGNOSIS — Z20822 Contact with and (suspected) exposure to covid-19: Secondary | ICD-10-CM | POA: Diagnosis not present

## 2021-07-14 DIAGNOSIS — Z7984 Long term (current) use of oral hypoglycemic drugs: Secondary | ICD-10-CM | POA: Diagnosis not present

## 2021-07-14 DIAGNOSIS — Z79899 Other long term (current) drug therapy: Secondary | ICD-10-CM | POA: Diagnosis not present

## 2021-07-14 DIAGNOSIS — R059 Cough, unspecified: Secondary | ICD-10-CM | POA: Diagnosis present

## 2021-07-14 DIAGNOSIS — J069 Acute upper respiratory infection, unspecified: Secondary | ICD-10-CM | POA: Diagnosis not present

## 2021-07-14 DIAGNOSIS — E119 Type 2 diabetes mellitus without complications: Secondary | ICD-10-CM | POA: Insufficient documentation

## 2021-07-14 DIAGNOSIS — I10 Essential (primary) hypertension: Secondary | ICD-10-CM | POA: Diagnosis not present

## 2021-07-14 LAB — RESP PANEL BY RT-PCR (FLU A&B, COVID) ARPGX2
Influenza A by PCR: NEGATIVE
Influenza B by PCR: NEGATIVE
SARS Coronavirus 2 by RT PCR: NEGATIVE

## 2021-07-14 NOTE — ED Triage Notes (Signed)
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

## 2021-07-14 NOTE — ED Provider Notes (Signed)
[facility] EMERGENCY DEPT Provider Note   CSN: [id number] Arrival date & time: 07/14/21  1833     History Chief Complaint  Patient presents with   Cough    Jared Hudson is a 45 y.o. male.  Patient is a 45 year old male with a history of diabetes, hypertension, GERD, asthma and sleep apnea who presents with URI symptoms.  He has a 1 day history of runny nose congestion and coughing.  No known fevers.  No shortness of breath.  No nausea vomiting or diarrhea.  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  He has not taken any medications for his symptoms.      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   Seasonal and perennial allergic rhinitis 11/27/2020   Mild persistent asthma, uncomplicated 11/27/2020   Flexural atopic dermatitis 11/27/2020    Past Surgical History:  Procedure Laterality Date   ADENOIDECTOMY     TONSILLECTOMY         Family History  Problem Relation Age of Onset   Asthma Paternal Grandfather    Allergic rhinitis Neg Hx    Angioedema Neg Hx    Eczema Neg Hx    Immunodeficiency Neg Hx    Urticaria Neg Hx     Social History   Tobacco Use   Smoking status: Never   Smokeless tobacco: Never  Vaping Use   Vaping Use: Never used  Substance Use Topics   Alcohol use: No   Drug use: No    Home Medications Prior to Admission medications   Medication Sig Start Date End Date Taking? Authorizing Provider  amLODipine (NORVASC) 5 MG tablet Take by mouth. 05/25/20 05/25/21  [provider]  cetirizine (ZYRTEC) 10 MG tablet Take 1 tablet (10 mg total) by mouth daily. 04/07/21   Derwood Kaplan, MD  ciclesonide (ALVESCO) 160 MCG/ACT inhaler Inhale 2 puffs into the  lungs 2 (two) times daily. 11/26/20   Alfonse Spruce, MD  fluticasone [facility]) 50 MCG/ACT nasal spray 2 sprays into each nostril daily. 03/08/20 03/08/21  [provider]  fluticasone (FLOVENT HFA) 110 MCG/ACT inhaler Inhale 2 puffs into the lungs in the morning and at bedtime. Rinse, gargle and spit out after use. 12/12/20   Alfonse Spruce, MD  hydrochlorothiazide (HYDRODIURIL) 25 MG tablet Take by mouth. 12/30/19 12/29/20  [provider]  ibuprofen (ADVIL) 600 MG tablet TAKE 1 TABLET BY MOUTH EVERY 6 HOURS FOR 3 DAYS 11/01/20   [provider]  metFORMIN (GLUCOPHAGE) 500 MG tablet Take by mouth. 03/18/19 11/26/20  [provider]  omeprazole (PRILOSEC) 40 MG capsule Take 1 capsule (40 mg total) by mouth daily. 03/10/20   Molpus, John, MD  ondansetron (ZOFRAN ODT) 4 MG disintegrating tablet 4mg  ODT q4 hours prn nausea/vomit 12/25/20   12/27/20, DO  triamcinolone ointment (KENALOG) 0.1 % Apply 1 application topically 2 (two) times daily. 11/27/20   11/29/20, MD  pantoprazole (PROTONIX) 40 MG tablet Take 1 tablet (40 mg total) by mouth daily. 11/10/17 03/10/20  Ward, 03/12/20, DO    Allergies    Eggs or egg-derived products and Other  Review of Systems  Review of Systems  Constitutional:  Negative for chills, diaphoresis, fatigue and fever.  HENT:  Positive for congestion and rhinorrhea. Negative for sneezing.   Eyes: Negative.   Respiratory:  Positive for cough. Negative for chest tightness and shortness of breath.   Cardiovascular:  Negative for chest pain and leg swelling.  Gastrointestinal:  Negative for abdominal pain, blood in stool, diarrhea, nausea and vomiting.  Genitourinary:  Negative for difficulty urinating, flank pain, frequency and hematuria.  Musculoskeletal:  Negative for arthralgias and back pain.  Skin:  Negative for rash.  Neurological:  Negative for dizziness, speech difficulty, weakness, numbness and headaches.    Physical Exam Updated Vital Signs BP (!) 153/100 (BP Location: Right Arm)   Pulse 100   Temp 98.4 F (36.9 C) (Oral)   Resp 20   Ht 6\' 3"  (1.905 m)   Wt (!) 158.8 kg   SpO2 95%   BMI 43.75 kg/m   Physical Exam Constitutional:      Appearance: He is well-developed.  HENT:     Head: Normocephalic and atraumatic.     Right Ear: Tympanic membrane normal.     Left Ear: Tympanic membrane normal.     Mouth/Throat:     Mouth: Mucous membranes are moist.     Pharynx: No oropharyngeal exudate or posterior oropharyngeal erythema.  Eyes:     Pupils: Pupils are equal, round, and reactive to light.  Cardiovascular:     Rate and Rhythm: Normal rate and regular rhythm.     Heart sounds: Normal heart sounds.  Pulmonary:     Effort: Pulmonary effort is normal. No respiratory distress.     Breath sounds: Normal breath sounds. No wheezing or rales.  Chest:     Chest wall: No tenderness.  Abdominal:     General: Bowel sounds are normal.     Palpations: Abdomen is soft.     Tenderness: There is no abdominal tenderness. There is no guarding or rebound.  Musculoskeletal:        General: Normal range of motion.     Cervical back: Normal range of motion and neck supple.  Lymphadenopathy:     Cervical: No cervical adenopathy.  Skin:    General: Skin is warm and dry.     Findings: No rash.  Neurological:     Mental Status: He is alert and oriented to person, place, and time.    ED Results / Procedures / Treatments   Labs (all labs ordered are listed, but only abnormal results are displayed) Labs Reviewed  RESP PANEL BY RT-PCR (FLU A&B, COVID) [id number]    EKG None  Radiology DG Chest Portable 1 View  Result Date: 07/14/2021 CLINICAL DATA:  Cough. EXAM: PORTABLE CHEST 1 VIEW COMPARISON:  07/05/2019 FINDINGS: The heart size and mediastinal contours are within normal limits. Both lungs are clear. The visualized skeletal structures are unremarkable. IMPRESSION: No active disease.  Electronically Signed   By: 07/07/2019 M.D.   On: 07/14/2021 19:52    Procedures Procedures   Medications Ordered in ED Medications - No data to display  ED Course  I have reviewed the triage vital signs and the nursing notes.  Pertinent labs & imaging results that were available during my care of the patient were reviewed by me and considered in my medical decision making (see chart for details).    MDM Rules/Calculators/A&P  Patient is a 45 year old male who presents with URI symptoms.  He is otherwise well-appearing.  His chest x-ray is clear without evidence of pneumonia.  His COVID test is negative.  Influenza test is negative.  He was discharged home in good condition.  Symptomatic care instructions and return precautions were given. Final Clinical Impression(s) / ED Diagnoses Final diagnoses:  Viral upper respiratory tract infection    Rx / DC Orders ED Discharge Orders     None        Rolan Bucco, MD 07/14/21 2112

## 2021-07-14 NOTE — ED Notes (Signed)
ED Provider at bedside. 

## 2021-07-17 ENCOUNTER — Emergency Department (HOSPITAL_BASED_OUTPATIENT_CLINIC_OR_DEPARTMENT_OTHER)
Admission: EM | Admit: 2021-07-17 | Discharge: 2021-07-17 | Disposition: A | Discharge status: Home / Self Care | Payer: 59 | Attending: Emergency Medicine | Admitting: Emergency Medicine

## 2021-07-17 ENCOUNTER — Other Ambulatory Visit: Payer: Self-pay

## 2021-07-17 ENCOUNTER — Encounter (HOSPITAL_BASED_OUTPATIENT_CLINIC_OR_DEPARTMENT_OTHER): Payer: Self-pay

## 2021-07-17 DIAGNOSIS — J069 Acute upper respiratory infection, unspecified: Secondary | ICD-10-CM | POA: Diagnosis not present

## 2021-07-17 DIAGNOSIS — Z79899 Other long term (current) drug therapy: Secondary | ICD-10-CM | POA: Insufficient documentation

## 2021-07-17 DIAGNOSIS — R059 Cough, unspecified: Secondary | ICD-10-CM | POA: Diagnosis present

## 2021-07-17 DIAGNOSIS — E119 Type 2 diabetes mellitus without complications: Secondary | ICD-10-CM | POA: Diagnosis not present

## 2021-07-17 DIAGNOSIS — J4530 Mild persistent asthma, uncomplicated: Secondary | ICD-10-CM | POA: Diagnosis not present

## 2021-07-17 DIAGNOSIS — Z7984 Long term (current) use of oral hypoglycemic drugs: Secondary | ICD-10-CM | POA: Diagnosis not present

## 2021-07-17 DIAGNOSIS — Z7951 Long term (current) use of inhaled steroids: Secondary | ICD-10-CM | POA: Diagnosis not present

## 2021-07-17 DIAGNOSIS — I10 Essential (primary) hypertension: Secondary | ICD-10-CM | POA: Insufficient documentation

## 2021-07-17 NOTE — Discharge Instructions (Addendum)
Please refer to the attached instructions. Tylenol and/or ibuprofen for fever and discomfort as discussed. Follow-up with your PCP if no improvement.

## 2021-07-17 NOTE — ED Provider Notes (Signed)
[facility] EMERGENCY DEPT Provider Note   CSN: [id number] Arrival date & time: 07/17/21  1707     History Chief Complaint  Patient presents with   URI    Jared Hudson is a 45 y.o. male.  Patient seen on 07/14/21, diagnosed with URI. He was COVID negative. Normal CXR. He endorses continued low grade temperature (100 or less), along with intermittent cough. Runny nose. Blew his nose earlier, noted some blood. No chest pain or shortness of breath. No abdominal pain, N/V/D. Blood sugars have been running higher than normal at home since onset of illness.   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   Seasonal and perennial allergic rhinitis 11/27/2020   Mild persistent asthma, uncomplicated 11/27/2020   Flexural atopic dermatitis 11/27/2020    Past Surgical History:  Procedure Laterality Date   ADENOIDECTOMY     TONSILLECTOMY         Family History  Problem Relation Age of Onset   Asthma Paternal Grandfather    Allergic rhinitis Neg Hx    Angioedema Neg Hx    Eczema Neg Hx    Immunodeficiency Neg Hx    Urticaria Neg Hx     Social History   Tobacco Use   Smoking status: Never    Passive exposure: Past   Smokeless tobacco: Never  Vaping Use   Vaping Use: Never used  Substance Use Topics   Alcohol use: No   Drug use: No    Home Medications Prior to Admission medications   Medication Sig Start Date End Date Taking? Authorizing Provider  amLODipine (NORVASC) 5 MG tablet Take by mouth. 05/25/20 05/25/21  [provider]  cetirizine (ZYRTEC)  10 MG tablet Take 1 tablet (10 mg total) by mouth daily. 04/07/21   Derwood Kaplan, MD  ciclesonide (ALVESCO) 160 MCG/ACT inhaler Inhale 2 puffs into the lungs 2 (two) times daily. 11/26/20   Alfonse Spruce, MD  fluticasone [facility]) 50 MCG/ACT nasal spray 2 sprays into each nostril daily. 03/08/20 03/08/21  [provider]  fluticasone (FLOVENT HFA) 110 MCG/ACT inhaler Inhale 2 puffs into the lungs in the morning and at bedtime. Rinse, gargle and spit out after use. 12/12/20   Alfonse Spruce, MD  hydrochlorothiazide (HYDRODIURIL) 25 MG tablet Take by mouth. 12/30/19 12/29/20  [provider]  ibuprofen (ADVIL) 600 MG tablet TAKE 1 TABLET BY MOUTH EVERY 6 HOURS FOR 3 DAYS 11/01/20   [provider]  metFORMIN (GLUCOPHAGE) 500 MG tablet Take by mouth. 03/18/19 11/26/20  [provider]  omeprazole (PRILOSEC) 40 MG capsule Take 1 capsule (40 mg total) by mouth daily. 03/10/20   Molpus, John, MD  ondansetron (ZOFRAN ODT) 4 MG disintegrating tablet 4mg  ODT q4 hours prn nausea/vomit 12/25/20   12/27/20, DO  triamcinolone ointment (KENALOG) 0.1 % Apply 1 application topically 2 (two) times daily. 11/27/20   11/29/20, MD  pantoprazole (PROTONIX) 40 MG tablet Take 1 tablet (40 mg total) by mouth daily. 11/10/17 03/10/20  Ward, 03/12/20  N, DO    Allergies    Eggs or egg-derived products and Other  Review of Systems   Review of Systems  Constitutional:  Positive for fever.  HENT:  Positive for congestion and rhinorrhea. Negative for sore throat.   Respiratory:  Positive for cough.   All other systems reviewed and are negative.  Physical Exam Updated Vital Signs BP (!) 136/99 (BP Location: Right Arm)   Pulse 97   Temp 98.9 F (37.2 C) (Oral)   Resp 18   Ht 6\' 3"  (1.905 m)   Wt (!) 158.3 kg   SpO2 96%   BMI 43.62 kg/m   Physical Exam Constitutional:      Appearance: Normal appearance.  HENT:     Head: Normocephalic.     Nose: Congestion  present.     Comments: Swollen turbinates, no active bleeding Eyes:     Conjunctiva/sclera: Conjunctivae normal.  Cardiovascular:     Rate and Rhythm: Normal rate.  Pulmonary:     Effort: Pulmonary effort is normal.     Breath sounds: Normal breath sounds.  Abdominal:     Palpations: Abdomen is soft.  Musculoskeletal:        General: Normal range of motion.     Cervical back: Neck supple.  Skin:    General: Skin is warm and dry.  Neurological:     Mental Status: He is alert and oriented to person, place, and time.  Psychiatric:        Mood and Affect: Mood normal.        Behavior: Behavior normal.    ED Results / Procedures / Treatments   Labs (all labs ordered are listed, but only abnormal results are displayed) Labs Reviewed - No data to display  EKG None  Radiology No results found.  Procedures Procedures   Medications Ordered in ED Medications - No data to display  ED Course  I have reviewed the triage vital signs and the nursing notes.  Pertinent labs & imaging results that were available during my care of the patient were reviewed by me and considered in my medical decision making (see chart for details).    MDM Rules/Calculators/A&P                           Pt symptoms consistent with URI. Pt will be discharged with symptomatic treatment.  Discussed return precautions.  Pt is hemodynamically stable & in NAD prior to discharge.  Final Clinical Impression(s) / ED Diagnoses Final diagnoses:  Upper respiratory tract infection, unspecified type    Rx / DC Orders ED Discharge Orders     None        , NP 07/17/21 2140    09/16/21, DO 07/17/21 2341

## 2021-07-17 NOTE — ED Triage Notes (Signed)
Seen here in ED on 10/02, neg for Covid but continues URI symptoms including coughing w/green, brown sputum and nasal congestion. Afebrile.

## 2021-09-03 ENCOUNTER — Encounter (HOSPITAL_BASED_OUTPATIENT_CLINIC_OR_DEPARTMENT_OTHER): Payer: Self-pay

## 2021-09-03 ENCOUNTER — Emergency Department (HOSPITAL_BASED_OUTPATIENT_CLINIC_OR_DEPARTMENT_OTHER)
Admission: EM | Admit: 2021-09-03 | Discharge: 2021-09-03 | Disposition: A | Discharge status: Home / Self Care | Payer: 59 | Attending: Emergency Medicine | Admitting: Emergency Medicine

## 2021-09-03 ENCOUNTER — Other Ambulatory Visit: Payer: Self-pay

## 2021-09-03 DIAGNOSIS — R000 Tachycardia, unspecified: Secondary | ICD-10-CM | POA: Insufficient documentation

## 2021-09-03 DIAGNOSIS — J4540 Moderate persistent asthma, uncomplicated: Secondary | ICD-10-CM | POA: Insufficient documentation

## 2021-09-03 DIAGNOSIS — Z20822 Contact with and (suspected) exposure to covid-19: Secondary | ICD-10-CM | POA: Diagnosis not present

## 2021-09-03 DIAGNOSIS — Z79899 Other long term (current) drug therapy: Secondary | ICD-10-CM | POA: Diagnosis not present

## 2021-09-03 DIAGNOSIS — Z7984 Long term (current) use of oral hypoglycemic drugs: Secondary | ICD-10-CM | POA: Insufficient documentation

## 2021-09-03 DIAGNOSIS — I10 Essential (primary) hypertension: Secondary | ICD-10-CM | POA: Insufficient documentation

## 2021-09-03 DIAGNOSIS — Z7951 Long term (current) use of inhaled steroids: Secondary | ICD-10-CM | POA: Diagnosis not present

## 2021-09-03 DIAGNOSIS — Z87891 Personal history of nicotine dependence: Secondary | ICD-10-CM | POA: Diagnosis not present

## 2021-09-03 DIAGNOSIS — R059 Cough, unspecified: Secondary | ICD-10-CM | POA: Diagnosis present

## 2021-09-03 DIAGNOSIS — E119 Type 2 diabetes mellitus without complications: Secondary | ICD-10-CM | POA: Insufficient documentation

## 2021-09-03 DIAGNOSIS — R0981 Nasal congestion: Secondary | ICD-10-CM | POA: Diagnosis not present

## 2021-09-03 DIAGNOSIS — R6889 Other general symptoms and signs: Secondary | ICD-10-CM

## 2021-09-03 LAB — RESP PANEL BY RT-PCR (FLU A&B, COVID) ARPGX2
Influenza A by PCR: NEGATIVE
Influenza B by PCR: NEGATIVE
SARS Coronavirus 2 by RT PCR: NEGATIVE

## 2021-09-03 MED ORDER — ONDANSETRON HCL 4 MG PO TABS: 4.0000 mg | ORAL_TABLET | Freq: Four times a day (QID) | ORAL | 0 refills | Status: DC

## 2021-09-03 NOTE — ED Provider Notes (Addendum)
[place] EMERGENCY DEPT Provider Note   CSN: [id number] Arrival date & time: 09/03/21  2158     History Chief Complaint  Patient presents with   Abdominal Pain   Cough   Nausea    Jared Hudson is a 45 y.o. male.  The history is provided by the patient.  URI Presenting symptoms: congestion and cough   Presenting symptoms: no ear pain, no fever and no sore throat   Severity:  Moderate Onset quality:  Gradual Duration:  2 days Timing:  Constant Progression:  Worsening Chronicity:  New Relieved by:  Nothing Worsened by:  Nothing Associated symptoms: sinus pain   Associated symptoms: no arthralgias, no headaches, no myalgias, no neck pain, no swollen glands and no wheezing   Risk factors: sick contacts       Past Medical History:  Diagnosis Date   Asthma    Diabetes ([place])    GERD (gastroesophageal reflux disease)    Hypertension    Hypertension    Sleep apnea     Patient Active Problem List   Diagnosis Date Noted   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   Seasonal and perennial allergic rhinitis 11/27/2020   Mild persistent asthma, uncomplicated 0000000   Flexural atopic dermatitis 11/27/2020    Past Surgical History:  Procedure Laterality Date   ADENOIDECTOMY     TONSILLECTOMY         Family History  Problem Relation Age of Onset   Asthma Paternal Grandfather    Allergic rhinitis Neg Hx    Angioedema Neg Hx    Eczema Neg Hx    Immunodeficiency Neg Hx    Urticaria Neg Hx     Social History   Tobacco Use   Smoking status: Never    Passive exposure: Past   Smokeless tobacco: Never  Vaping Use   Vaping Use: Never used  Substance Use Topics   Alcohol use: No   Drug use: No    Home Medications Prior to Admission medications   Medication Sig Start Date End Date Taking? Authorizing Provider  ondansetron (ZOFRAN) 4 MG tablet Take 1 tablet (4 mg total) by mouth every 6 (six) hours. 09/03/21  Yes  Spyridon Hornstein, DO  amLODipine (NORVASC) 5 MG tablet Take by mouth. 05/25/20 05/25/21  [provider]  cetirizine (ZYRTEC) 10 MG tablet Take 1 tablet (10 mg total) by mouth daily. 04/07/21   Varney Biles, MD  ciclesonide (ALVESCO) 160 MCG/ACT inhaler Inhale 2 puffs into the lungs 2 (two) times daily. 11/26/20   Valentina Shaggy, MD  fluticasone [facility]) 50 MCG/ACT nasal spray 2 sprays into each nostril daily. 03/08/20 03/08/21  [provider]  fluticasone (FLOVENT HFA) 110 MCG/ACT inhaler Inhale 2 puffs into the lungs in the morning and at bedtime. Rinse, gargle and spit out after use. 12/12/20   Valentina Shaggy, MD  hydrochlorothiazide (HYDRODIURIL) 25 MG tablet Take by mouth. 12/30/19 12/29/20  [provider]  ibuprofen (ADVIL) 600 MG tablet TAKE 1 TABLET BY MOUTH EVERY 6 HOURS FOR 3 DAYS 11/01/20   [provider]  metFORMIN (GLUCOPHAGE) 500 MG tablet Take by mouth. 03/18/19 11/26/20  [provider]  omeprazole (PRILOSEC) 40 MG capsule Take 1 capsule (40 mg total) by mouth daily. 03/10/20   Molpus, John, MD  ondansetron (ZOFRAN ODT) 4 MG disintegrating tablet 4mg  ODT q4 hours prn nausea/vomit 12/25/20   Deno Etienne, DO  triamcinolone ointment (KENALOG) 0.1 % Apply 1 application topically 2 (  two) times daily. 11/27/20   Alfonse Spruce, MD  pantoprazole (PROTONIX) 40 MG tablet Take 1 tablet (40 mg total) by mouth daily. 11/10/17 03/10/20  Ward, Layla Maw, DO    Allergies    Eggs or egg-derived products and Other  Review of Systems   Review of Systems  Constitutional:  Negative for chills and fever.  HENT:  Positive for congestion and sinus pain. Negative for ear pain and sore throat.   Eyes:  Negative for pain and visual disturbance.  Respiratory:  Positive for cough. Negative for shortness of breath and wheezing.   Cardiovascular:  Negative for chest pain and palpitations.  Gastrointestinal:  Positive for abdominal pain (resolved, acid  taste in upper abdomen). Negative for vomiting.  Genitourinary:  Negative for dysuria and hematuria.  Musculoskeletal:  Negative for arthralgias, back pain, myalgias and neck pain.  Skin:  Negative for color change and rash.  Neurological:  Negative for seizures, syncope and headaches.  All other systems reviewed and are negative.  Physical Exam Updated Vital Signs BP (!) 137/96 (BP Location: Right Arm)   Pulse 99   Temp 98.4 F (36.9 C) (Oral)   Resp 20   SpO2 100%   Physical Exam Vitals and nursing note reviewed.  Constitutional:      General: He is not in acute distress.    Appearance: He is well-developed.  HENT:     Head: Normocephalic and atraumatic.     Mouth/Throat:     Mouth: Mucous membranes are moist.  Eyes:     Extraocular Movements: Extraocular movements intact.     Conjunctiva/sclera: Conjunctivae normal.  Cardiovascular:     Rate and Rhythm: Normal rate and regular rhythm.     Heart sounds: Normal heart sounds. No murmur heard. Pulmonary:     Effort: Pulmonary effort is normal. No respiratory distress.     Breath sounds: Normal breath sounds.  Abdominal:     Palpations: Abdomen is soft.     Tenderness: There is no abdominal tenderness. There is no guarding or rebound.  Musculoskeletal:        General: No swelling.     Cervical back: Neck supple.  Skin:    General: Skin is warm and dry.     Capillary Refill: Capillary refill takes less than 2 seconds.  Neurological:     General: No focal deficit present.     Mental Status: He is alert.  Psychiatric:        Mood and Affect: Mood normal.    ED Results / Procedures / Treatments   Labs (all labs ordered are listed, but only abnormal results are displayed) Labs Reviewed  RESP PANEL BY RT-PCR (FLU A&B, COVID) ARPGX2    EKG EKG Interpretation  Date/Time:  Tuesday September 03 2021 22:19:26 EST Ventricular Rate:  103 PR Interval:  169 QRS Duration: 102 QT Interval:  346 QTC Calculation: 453 R  Axis:   30 Text Interpretation: Sinus tachycardia Confirmed by Virgina Norfolk ([id number]) on 09/03/2021 10:25:33 PM  Radiology No results found.  Procedures Procedures   Medications Ordered in ED Medications - No data to display  ED Course  I have reviewed the triage vital signs and the nursing notes.  Pertinent labs & imaging results that were available during my care of the patient were reviewed by me and considered in my medical decision making (see chart for details).    MDM Rules/Calculators/A&P  STATON BYFIELD is a 45 year old male with history of hypertension, diabetes, reflux who presents the ED with cough and congestion.  Had brief episode of upper abdominal pain but now resolved.  Multiple sick contacts at work.  Patient with normal vitals here.  No fever.  Nasal congestion since yesterday.  Body aches and chills today.  EKG shows sinus rhythm.  No ischemic changes.  No concern for cardiac or abdominal process at this time.  Abdominal exam is benign.  Sounds like he has a viral process.  Recommend Tylenol and ibuprofen.  Written for Zofran to use as needed for nausea.  Discharged in good condition.  Understands return precautions.  This chart was dictated using voice recognition software.  Despite best efforts to proofread,  errors can occur which can change the documentation meaning.   Final Clinical Impression(s) / ED Diagnoses Final diagnoses:  Flu-like symptoms    Rx / DC Orders ED Discharge Orders          Ordered    ondansetron (ZOFRAN) 4 MG tablet  Every 6 hours        09/03/21 2231             Lennice Sites, DO 09/03/21 [place], Leona, DO 09/03/21 2306

## 2021-09-03 NOTE — Discharge Instructions (Signed)
Overall suspect that you have the flu or other viral process.  If you develop any worsening/focal abdominal pain/uncontrollable nausea or vomiting or other concerning symptoms please return for evaluation.  Follow-up your viral testing on your MyChart which should be available in the next several hours.  Recommend Tylenol and ibuprofen to use for pain and fever.  I have written you for Zofran to help with any nausea symptoms.

## 2021-09-03 NOTE — ED Triage Notes (Signed)
Pt presents with chills, congestion, cough and "sour" stomach starting approx 2 hours ago. Pt works in a [employer] and was having episodes of "sweats". Pt took thera flu after his symptoms started w/no relief

## 2021-10-04 ENCOUNTER — Emergency Department (HOSPITAL_BASED_OUTPATIENT_CLINIC_OR_DEPARTMENT_OTHER)
Admission: EM | Admit: 2021-10-04 | Discharge: 2021-10-04 | Disposition: A | Discharge status: Home / Self Care | Payer: 59 | Attending: Emergency Medicine | Admitting: Emergency Medicine

## 2021-10-04 ENCOUNTER — Other Ambulatory Visit: Payer: Self-pay

## 2021-10-04 ENCOUNTER — Encounter (HOSPITAL_BASED_OUTPATIENT_CLINIC_OR_DEPARTMENT_OTHER): Payer: Self-pay | Admitting: Emergency Medicine

## 2021-10-04 DIAGNOSIS — Z20822 Contact with and (suspected) exposure to covid-19: Secondary | ICD-10-CM | POA: Insufficient documentation

## 2021-10-04 DIAGNOSIS — Z79899 Other long term (current) drug therapy: Secondary | ICD-10-CM | POA: Insufficient documentation

## 2021-10-04 DIAGNOSIS — R5383 Other fatigue: Secondary | ICD-10-CM | POA: Diagnosis not present

## 2021-10-04 DIAGNOSIS — J4530 Mild persistent asthma, uncomplicated: Secondary | ICD-10-CM | POA: Diagnosis not present

## 2021-10-04 DIAGNOSIS — R42 Dizziness and giddiness: Secondary | ICD-10-CM | POA: Diagnosis not present

## 2021-10-04 DIAGNOSIS — I10 Essential (primary) hypertension: Secondary | ICD-10-CM | POA: Insufficient documentation

## 2021-10-04 DIAGNOSIS — R0981 Nasal congestion: Secondary | ICD-10-CM | POA: Insufficient documentation

## 2021-10-04 DIAGNOSIS — Z7984 Long term (current) use of oral hypoglycemic drugs: Secondary | ICD-10-CM | POA: Diagnosis not present

## 2021-10-04 DIAGNOSIS — R519 Headache, unspecified: Secondary | ICD-10-CM | POA: Diagnosis not present

## 2021-10-04 DIAGNOSIS — R6889 Other general symptoms and signs: Secondary | ICD-10-CM

## 2021-10-04 DIAGNOSIS — E119 Type 2 diabetes mellitus without complications: Secondary | ICD-10-CM | POA: Diagnosis not present

## 2021-10-04 LAB — CBC
HCT: 46.3 % (ref 39.0–52.0)
Hemoglobin: 14.8 g/dL (ref 13.0–17.0)
MCH: 29.8 pg (ref 26.0–34.0)
MCHC: 32 g/dL (ref 30.0–36.0)
MCV: 93.2 fL (ref 80.0–100.0)
Platelets: 194 10*3/uL (ref 150–400)
RBC: 4.97 MIL/uL (ref 4.22–5.81)
RDW: 12.8 % (ref 11.5–15.5)
WBC: 11.2 10*3/uL — ABNORMAL HIGH (ref 4.0–10.5)
nRBC: 0 % (ref 0.0–0.2)

## 2021-10-04 LAB — BASIC METABOLIC PANEL
Anion gap: 6 (ref 5–15)
BUN: 10 mg/dL (ref 6–20)
CO2: 32 mmol/L (ref 22–32)
Calcium: 9.1 mg/dL (ref 8.9–10.3)
Chloride: 100 mmol/L (ref 98–111)
Creatinine, Ser: 0.88 mg/dL (ref 0.61–1.24)
GFR, Estimated: >60 mL/min (ref >60)
Glucose, Bld: 150 mg/dL — ABNORMAL HIGH (ref 70–99)
Potassium: 3.7 mmol/L (ref 3.5–5.1)
Sodium: 138 mmol/L (ref 135–145)

## 2021-10-04 LAB — URINALYSIS, ROUTINE W REFLEX MICROSCOPIC
Bilirubin Urine: NEGATIVE
Glucose, UA: NEGATIVE mg/dL
Hgb urine dipstick: NEGATIVE
Ketones, ur: NEGATIVE mg/dL
Leukocytes,Ua: NEGATIVE
Nitrite: NEGATIVE
Protein, ur: NEGATIVE mg/dL
Specific Gravity, Urine: 1.022 (ref 1.005–1.030)
pH: 5.5 (ref 5.0–8.0)

## 2021-10-04 LAB — CBG MONITORING, ED: Glucose-Capillary: 148 mg/dL — ABNORMAL HIGH (ref 70–99)

## 2021-10-04 LAB — RESP PANEL BY RT-PCR (FLU A&B, COVID) ARPGX2
Influenza A by PCR: NEGATIVE
Influenza B by PCR: NEGATIVE
SARS Coronavirus 2 by RT PCR: NEGATIVE

## 2021-10-04 NOTE — ED Triage Notes (Signed)
Pt arrives pov with c/o chills and fatigue tonight after arriving to work. Also reports dizziness pta, denies at this time. Pt also reports HA. Pt AOx4. Pt also reports congestion

## 2021-10-04 NOTE — ED Provider Notes (Signed)
[place] EMERGENCY DEPT Provider Note   CSN: [id number] Arrival date & time: 10/04/21  2015     History No chief complaint on file.   Jared Hudson is a 45 y.o. male.  Patient was feeling fine until this evening.  Had sudden onset of chills and fatigue and some congestion and pressure in his face.  At triage he reported dizziness.  But patient did not state any dizziness to me.  Had a mild headache not severe.  Patient has a history hypertension history of coronary artery disease.  No nausea vomiting or diarrhea.  Patient is on hydrochlorothiazide.  Has a history of diabetes is on metformin.  Blood pressure little elevated here today.  He says normally is under control most recently was 151/98 oxygen saturation 98%.  Temp 99.7.      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   Seasonal and perennial allergic rhinitis 11/27/2020   Mild persistent asthma, uncomplicated 0000000   Flexural atopic dermatitis 11/27/2020    Past Surgical History:  Procedure Laterality Date   ADENOIDECTOMY     TONSILLECTOMY         Family History  Problem Relation Age of Onset   Asthma Paternal Grandfather    Allergic rhinitis Neg Hx    Angioedema Neg Hx    Eczema Neg Hx    Immunodeficiency Neg Hx    Urticaria Neg Hx     Social History   Tobacco Use   Smoking status: Never    Passive exposure: Past   Smokeless tobacco: Never  Vaping Use   Vaping Use: Never used  Substance Use Topics   Alcohol use: No   Drug use: No    Home Medications Prior to Admission medications   Medication Sig Start Date End Date Taking? Authorizing Provider  amLODipine (NORVASC) 5 MG tablet Take by mouth. 05/25/20 05/25/21  [provider]   cetirizine (ZYRTEC) 10 MG tablet Take 1 tablet (10 mg total) by mouth daily. 04/07/21   Varney Biles, MD  ciclesonide (ALVESCO) 160 MCG/ACT inhaler Inhale 2 puffs into the lungs 2 (two) times daily. 11/26/20   Valentina Shaggy, MD  fluticasone [facility]) 50 MCG/ACT nasal spray 2 sprays into each nostril daily. 03/08/20 03/08/21  [provider]  fluticasone (FLOVENT HFA) 110 MCG/ACT inhaler Inhale 2 puffs into the lungs in the morning and at bedtime. Rinse, gargle and spit out after use. 12/12/20   Valentina Shaggy, MD  hydrochlorothiazide (HYDRODIURIL) 25 MG tablet Take by mouth. 12/30/19 12/29/20  [provider]  ibuprofen (ADVIL) 600 MG tablet TAKE 1 TABLET BY MOUTH EVERY 6 HOURS FOR 3 DAYS 11/01/20   [provider]  metFORMIN (GLUCOPHAGE) 500 MG tablet Take by mouth. 03/18/19 11/26/20  [provider]  omeprazole (PRILOSEC) 40 MG capsule Take 1 capsule (40 mg total) by mouth daily. 03/10/20   Molpus, John, MD  ondansetron (ZOFRAN ODT) 4 MG disintegrating tablet 4mg  ODT q4 hours prn nausea/vomit 12/25/20   Deno Etienne, DO  ondansetron (ZOFRAN) 4 MG tablet Take 1 tablet (4 mg total) by mouth every 6 (six) hours. 09/03/21   Curatolo, Adam, DO  triamcinolone ointment (KENALOG) 0.1 % Apply 1 application  topically 2 (two) times daily. 11/27/20   Alfonse Spruce, MD  pantoprazole (PROTONIX) 40 MG tablet Take 1 tablet (40 mg total) by mouth daily. 11/10/17 03/10/20  Ward, Layla Maw, DO    Allergies    Eggs or egg-derived products and Other  Review of Systems   Review of Systems  Constitutional:  Positive for chills and fatigue. Negative for fever.  HENT:  Positive for congestion. Negative for ear pain and sore throat.   Eyes:  Negative for pain and visual disturbance.  Respiratory:  Negative for cough and shortness of breath.   Cardiovascular:  Negative for chest pain and palpitations.  Gastrointestinal:  Negative for abdominal pain, diarrhea, nausea and  vomiting.  Genitourinary:  Negative for dysuria and hematuria.  Musculoskeletal:  Negative for arthralgias and back pain.  Skin:  Negative for color change and rash.  Neurological:  Positive for headaches. Negative for seizures and syncope.  All other systems reviewed and are negative.  Physical Exam Updated Vital Signs BP (!) 151/98    Pulse 60    Temp 99.7 F (37.6 C)    Resp 18    Ht 1.905 m (6\' 3" )    Wt (!) 154.2 kg    SpO2 98%    BMI 42.50 kg/m   Physical Exam Vitals and nursing note reviewed.  Constitutional:      General: He is not in acute distress.    Appearance: Normal appearance. He is well-developed.  HENT:     Head: Normocephalic and atraumatic.     Mouth/Throat:     Mouth: Mucous membranes are moist.     Pharynx: No posterior oropharyngeal erythema.  Eyes:     Extraocular Movements: Extraocular movements intact.     Conjunctiva/sclera: Conjunctivae normal.     Pupils: Pupils are equal, round, and reactive to light.  Cardiovascular:     Rate and Rhythm: Normal rate and regular rhythm.     Heart sounds: No murmur heard. Pulmonary:     Effort: Pulmonary effort is normal. No respiratory distress.     Breath sounds: Normal breath sounds. No wheezing, rhonchi or rales.  Abdominal:     Palpations: Abdomen is soft.     Tenderness: There is no abdominal tenderness.  Musculoskeletal:        General: No swelling.     Cervical back: Normal range of motion and neck supple.  Skin:    General: Skin is warm and dry.     Capillary Refill: Capillary refill takes less than 2 seconds.  Neurological:     General: No focal deficit present.     Mental Status: He is alert and oriented to person, place, and time.  Psychiatric:        Mood and Affect: Mood normal.    ED Results / Procedures / Treatments   Labs (all labs ordered are listed, but only abnormal results are displayed) Labs Reviewed  BASIC METABOLIC PANEL - Abnormal; Notable for the following components:       Result Value   Glucose, Bld 150 (*)    All other components within normal limits  CBC - Abnormal; Notable for the following components:   WBC 11.2 (*)    All other components within normal limits  CBG MONITORING, ED - Abnormal; Notable for the following components:   Glucose-Capillary 148 (*)    All other components within normal limits  RESP PANEL BY RT-PCR (FLU A&B, COVID) ARPGX2  URINALYSIS, ROUTINE W REFLEX MICROSCOPIC  EKG EKG Interpretation  Date/Time:  Friday October 04 2021 20:29:38 EST Ventricular Rate:  93 PR Interval:  158 QRS Duration: 98 QT Interval:  362 QTC Calculation: 450 R Axis:   19 Text Interpretation: Normal sinus rhythm Normal ECG Confirmed by Fredia Sorrow [phone] on 10/04/2021 10:47:56 PM  Radiology No results found.  Procedures Procedures   Medications Ordered in ED Medications - No data to display  ED Course  I have reviewed the triage vital signs and the nursing notes.  Pertinent labs & imaging results that were available during my care of the patient were reviewed by me and considered in my medical decision making (see chart for details).    MDM Rules/Calculators/A&P                          Patient nontoxic no acute distress.  COVID testing influenza testing negative.  Urinalysis is normal.  Patient metabolic panel significant for blood sugar of 150 but CO2 was 32 renal functions normal.  White blood count slightly elevated 11,000 hemoglobin normal at 14.8.  Suspect of viral illness flulike illness.  Will treat symptomatically.  Patient nontoxic.   Final Clinical Impression(s) / ED Diagnoses Final diagnoses:  Flu-like symptoms    Rx / DC Orders ED Discharge Orders     None        Fredia Sorrow, MD 10/04/21 2311

## 2021-10-04 NOTE — Discharge Instructions (Signed)
COVID and influenza testing is negative.  Labs normal including urinalysis.  Suspect some sort of viral illness.  Treat symptomatically.  Tylenol as needed for body aches or fever.  Recommend resting increasing fluids.  Return for any new or worse symptoms.  Over-the-counter cold and flu medicines as needed.

## 2021-10-12 ENCOUNTER — Other Ambulatory Visit: Payer: Self-pay

## 2021-10-12 ENCOUNTER — Encounter (HOSPITAL_BASED_OUTPATIENT_CLINIC_OR_DEPARTMENT_OTHER): Payer: Self-pay | Admitting: Emergency Medicine

## 2021-10-12 ENCOUNTER — Emergency Department (HOSPITAL_BASED_OUTPATIENT_CLINIC_OR_DEPARTMENT_OTHER)
Admission: EM | Admit: 2021-10-12 | Discharge: 2021-10-12 | Disposition: A | Discharge status: Home / Self Care | Payer: 59 | Attending: Emergency Medicine | Admitting: Emergency Medicine

## 2021-10-12 DIAGNOSIS — Z7722 Contact with and (suspected) exposure to environmental tobacco smoke (acute) (chronic): Secondary | ICD-10-CM | POA: Diagnosis not present

## 2021-10-12 DIAGNOSIS — E119 Type 2 diabetes mellitus without complications: Secondary | ICD-10-CM | POA: Insufficient documentation

## 2021-10-12 DIAGNOSIS — R0981 Nasal congestion: Secondary | ICD-10-CM | POA: Diagnosis not present

## 2021-10-12 DIAGNOSIS — Z7952 Long term (current) use of systemic steroids: Secondary | ICD-10-CM | POA: Diagnosis not present

## 2021-10-12 DIAGNOSIS — J45909 Unspecified asthma, uncomplicated: Secondary | ICD-10-CM | POA: Insufficient documentation

## 2021-10-12 DIAGNOSIS — Z760 Encounter for issue of repeat prescription: Secondary | ICD-10-CM | POA: Insufficient documentation

## 2021-10-12 DIAGNOSIS — Z7984 Long term (current) use of oral hypoglycemic drugs: Secondary | ICD-10-CM | POA: Insufficient documentation

## 2021-10-12 DIAGNOSIS — Z79899 Other long term (current) drug therapy: Secondary | ICD-10-CM | POA: Diagnosis not present

## 2021-10-12 DIAGNOSIS — I10 Essential (primary) hypertension: Secondary | ICD-10-CM | POA: Insufficient documentation

## 2021-10-12 MED ORDER — DICLOFENAC SODIUM 1 % EX GEL: 4.0000 g | Freq: Four times a day (QID) | CUTANEOUS | 0 refills | Status: AC

## 2021-10-12 MED ORDER — AMLODIPINE BESYLATE 5 MG PO TABS: 5.0000 mg | ORAL_TABLET | Freq: Every day | ORAL | 0 refills | Status: AC

## 2021-10-12 NOTE — ED Provider Notes (Signed)
[facility] EM[facility]GENC[facility]PT Provider Note   CSN: [id number] Arrival date & time: 10/12/21  2100     History Chief Complaint  Patient presents with   Medication Refill    Jared Hudson is a 45 y.o. male.  45 yo M with a chief complaints of sinus congestion headache and foot pain.  He has been having issues with his headache off and on for a few days.  He feels like congestion when he turns his head quickly.  He is on Flonase for this chronically.  He had an episode today where he felt like he was a bit more intense than normal.  He checked his blood pressure and it was elevated.  He tells me that he when he is pain his blood pressure goes up.  He dropped a pallet of apples onto his foot a couple weeks ago and is continued to have pain there and felt like the pain had gotten worse and so he checked his blood pressure it was elevated.  He took 2 of his HCTZ medicines and feels like maybe that helped.  Denies recurrent trauma.  Tells me that he has a very long walk into work every morning.  The history is provided by the patient.  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   Seasonal and perennial allergic rhinitis 11/27/2020   Mild persistent asthma, uncomplicated 11/27/2020   Flexural atopic dermatitis 11/27/2020    Past Surgical History:  Procedure Laterality Date   ADENOIDECTOMY     TONSILLECTOMY          Family History  Problem Relation Age of Onset   Asthma Paternal Grandfather    Allergic rhinitis Neg Hx    Angioedema Neg Hx    Eczema Neg Hx    Immunodeficiency Neg Hx    Urticaria Neg Hx     Social History   Tobacco Use   Smoking status: Never    Passive exposure: Past   Smokeless tobacco: Never  Vaping Use   Vaping Use: Never used  Substance Use Topics   Alcohol use: No   Drug use: No    Home Medications Prior to Admission medications   Medication Sig Start Date End Date Taking? Authorizing Provider  diclofenac Sodium (VOLTAREN) 1 % GEL Apply 4 g topically 4 (four) times daily. 10/12/21  Yes Melene Plan, DO  amLODipine (NORVASC) 5 MG tablet Take 1 tablet (5 mg total) by mouth daily. 10/12/21 10/12/22  Melene Plan, DO  cetirizine (ZYRTEC) 10 MG tablet Take 1 tablet (10 mg total) by mouth daily. 04/07/21   Derwood Kaplan, MD  ciclesonide (ALVESCO) 160 MCG/ACT inhaler Inhale 2 puffs into the lungs 2 (two) times daily. 11/26/20   Alfonse Spruce, MD  fluticasone [facility]) 50 MCG/ACT nasal spray 2 sprays into each nostril daily. 03/08/20 03/08/21  [provider]  fluticasone (FLOVENT HFA) 110  MCG/ACT inhaler Inhale 2 puffs into the lungs in the morning and at bedtime. Rinse, gargle and spit out after use. 12/12/20   Alfonse Spruce, MD  hydrochlorothiazide (HYDRODIURIL) 25 MG tablet Take by mouth. 12/30/19 12/29/20  [provider]  ibuprofen (ADVIL) 600 MG tablet TAKE 1 TABLET BY MOUTH EVERY 6 HOURS FOR 3 DAYS 11/01/20   [provider]  metFORMIN (GLUCOPHAGE) 500 MG tablet Take by mouth. 03/18/19 11/26/20  [provider]  omeprazole (PRILOSEC) 40 MG capsule Take 1 capsule (40 mg total) by mouth daily. 03/10/20   Molpus, John, MD  ondansetron (ZOFRAN ODT) 4 MG disintegrating tablet 4mg  ODT q4 hours prn nausea/vomit 12/25/20   12/27/20, DO  ondansetron (ZOFRAN) 4 MG tablet Take 1 tablet (4 mg total) by mouth every 6 (six) hours.  09/03/21   Curatolo, Adam, DO  triamcinolone ointment (KENALOG) 0.1 % Apply 1 application topically 2 (two) times daily. 11/27/20   11/29/20, MD  pantoprazole (PROTONIX) 40 MG tablet Take 1 tablet (40 mg total) by mouth daily. 11/10/17 03/10/20  Ward, 03/12/20, DO    Allergies    Eggs or egg-derived products and Other  Review of Systems   Review of Systems  Constitutional:  Negative for chills and fever.  HENT:  Negative for congestion and facial swelling.   Eyes:  Negative for discharge and visual disturbance.  Respiratory:  Negative for shortness of breath.   Cardiovascular:  Negative for chest pain and palpitations.  Gastrointestinal:  Negative for abdominal pain, diarrhea and vomiting.  Musculoskeletal:  Negative for arthralgias and myalgias.  Skin:  Negative for color change and rash.  Neurological:  Negative for tremors, syncope and headaches.  Psychiatric/Behavioral:  Negative for confusion and dysphoric mood.    Physical Exam Updated Vital Signs BP 133/87    Pulse (!) 106    Temp 98.8 F (37.1 C)    Resp 18    SpO2 98%   Physical Exam Vitals and nursing note reviewed.  Constitutional:      Appearance: He is well-developed.  HENT:     Head: Normocephalic and atraumatic.  Eyes:     Pupils: Pupils are equal, round, and reactive to light.  Neck:     Vascular: No JVD.  Cardiovascular:     Rate and Rhythm: Normal rate and regular rhythm.     Heart sounds: No murmur heard.   No friction rub. No gallop.  Pulmonary:     Effort: No respiratory distress.     Breath sounds: No wheezing.  Abdominal:     General: There is no distension.     Tenderness: There is no abdominal tenderness. There is no guarding or rebound.  Musculoskeletal:        General: Normal range of motion.     Cervical back: Normal range of motion and neck supple.  Skin:    Coloration: Skin is not pale.     Findings: No rash.  Neurological:     Mental Status: He is alert and oriented to  person, place, and time.  Psychiatric:        Behavior: Behavior normal.    ED Results / Procedures / Treatments   Labs (all labs ordered are listed, but only abnormal results are displayed) Labs Reviewed - No data to display  EKG None  Radiology No results found.  Procedures Procedures   Medications Ordered in ED Medications - No data to display  ED Course  I have reviewed  the triage vital signs and the nursing notes.  Pertinent labs & imaging results that were available during my care of the patient were reviewed by me and considered in my medical decision making (see chart for details).    MDM Rules/Calculators/A&P                         45 yo M with a cc of foot pain, high blood pressure.  Records reviewed hx of chronic nasal congestion.  Not hypertensive here.  Mildly tachycardic though appears to be tachycardic to just about every visit.  He has some pain to the foot though no obvious signs of trauma to the area.  Intact pulse motor and sensation.  His right lower extremity is somewhat warm but it is equal to the left lower limb compared.  He is requesting a prescription for his amlodipine as he has run out.  10:13 PM:  I have discussed the diagnosis/risks/treatment options with the patient and believe the pt to be eligible for discharge home to follow-up with PCP. We also discussed returning to the ED immediately if new or worsening sx occur. We discussed the sx which are most concerning (e.g., sudden worsening pain, fever, inability to tolerate by mouth) that necessitate immediate return. Medications administered to the patient during their visit and any new prescriptions provided to the patient are listed below.  Medications given during this visit Medications - No data to display   The patient appears reasonably screen and/or stabilized for discharge and I doubt any other medical condition or other [facility] requiring further screening, evaluation, or treatment in the ED at  this time prior to discharge.       Final Clinical Impression(s) / ED Diagnoses Final diagnoses:  Nasal congestion    Rx / DC Orders ED Discharge Orders          Ordered    amLODipine (NORVASC) 5 MG tablet  Daily        10/12/21 2209    diclofenac Sodium (VOLTAREN) 1 % GEL  4 times daily        10/12/21 2209             Melene Plan, DO 10/12/21 2213

## 2021-10-12 NOTE — Discharge Instructions (Signed)
Please follow-up with your family doctor and discussed with them your high blood pressure readings.  With your prolonged pain in your foot they may want to send you to see a foot doctor.  Use the gel as prescribed. Also take tylenol 1000mg (2 extra strength) four times a day.

## 2021-10-12 NOTE — ED Triage Notes (Addendum)
Reports hypertension, headache. When asked BP MAX, reports BP taken by this ED during visit on 12/23.  Took HCTZ two tabs today (RX dose is one tab daily). Out of amlodipine.

## 2021-11-15 ENCOUNTER — Other Ambulatory Visit: Payer: Self-pay

## 2021-11-15 ENCOUNTER — Encounter (HOSPITAL_COMMUNITY): Payer: Self-pay | Admitting: Emergency Medicine

## 2021-11-15 ENCOUNTER — Emergency Department (HOSPITAL_COMMUNITY)
Admission: EM | Admit: 2021-11-15 | Discharge: 2021-11-16 | Disposition: A | Discharge status: Home / Self Care | Payer: BC Managed Care – PPO | Attending: Emergency Medicine | Admitting: Emergency Medicine

## 2021-11-15 DIAGNOSIS — J4521 Mild intermittent asthma with (acute) exacerbation: Secondary | ICD-10-CM | POA: Diagnosis not present

## 2021-11-15 DIAGNOSIS — R0602 Shortness of breath: Secondary | ICD-10-CM

## 2021-11-15 DIAGNOSIS — Z7984 Long term (current) use of oral hypoglycemic drugs: Secondary | ICD-10-CM | POA: Diagnosis not present

## 2021-11-15 DIAGNOSIS — R202 Paresthesia of skin: Secondary | ICD-10-CM | POA: Insufficient documentation

## 2021-11-15 DIAGNOSIS — Z7951 Long term (current) use of inhaled steroids: Secondary | ICD-10-CM | POA: Diagnosis not present

## 2021-11-15 DIAGNOSIS — Z79899 Other long term (current) drug therapy: Secondary | ICD-10-CM | POA: Diagnosis not present

## 2021-11-15 DIAGNOSIS — I10 Essential (primary) hypertension: Secondary | ICD-10-CM | POA: Insufficient documentation

## 2021-11-15 DIAGNOSIS — R600 Localized edema: Secondary | ICD-10-CM | POA: Diagnosis not present

## 2021-11-15 DIAGNOSIS — E119 Type 2 diabetes mellitus without complications: Secondary | ICD-10-CM | POA: Insufficient documentation

## 2021-11-15 MED ORDER — ASPIRIN 81 MG PO CHEW
324.0000 mg | CHEWABLE_TABLET | Freq: Once | ORAL | Status: AC
  Administered 2021-11-16: 324 mg via ORAL
  Filled 2021-11-15: qty 4

## 2021-11-15 NOTE — ED Provider Triage Note (Signed)
Emergency Medicine Provider Triage Evaluation Note  Jared Hudson , a 46 y.o. male  was evaluated in triage.  Pt complains of SOB and DOE that began acutely around 2100 tonight. 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 Had an associated discomfort in his shoulders, paresthesias in his LUE radiating to his fingers. Symptoms have improved spontaneously, but remain mild. Hx of ACS in mother, MGM, maternal uncle around age 80. Patient is a nonsmoker and denies personal hx of ACS. No fevers, cough, leg swelling, hemoptysis.  Review of Systems  Positive: As above Negative: As above  Physical Exam  BP (!) 150/103    Pulse (!) 109    Temp 97.9 F (36.6 C) (Oral)    Resp 18    SpO2 96%  Gen:   Awake, no distress   Resp:  Normal effort  MSK:   Moves extremities without difficulty  Other:  Obese AA male. Lungs CTAB.  Medical Decision Making  Medically screening exam initiated at 11:47 PM.  Appropriate orders placed.  Jared Hudson was informed that the remainder of the evaluation will be completed by another provider, this initial triage assessment does not replace that evaluation, and the importance of remaining in the ED until their evaluation is complete.  SOB - pending work up   Antony Madura, PA-C 11/15/21 2356

## 2021-11-15 NOTE — ED Triage Notes (Signed)
Patient reports SOB and left arm/fingers tingling onset this evening , no chest pain , denies cough or fever .

## 2021-11-16 ENCOUNTER — Emergency Department (HOSPITAL_COMMUNITY): Payer: BC Managed Care – PPO

## 2021-11-16 LAB — CBC WITH DIFFERENTIAL/PLATELET
Abs Immature Granulocytes: 0.02 10*3/uL (ref 0.00–0.07)
Basophils Absolute: 0.1 10*3/uL (ref 0.0–0.1)
Basophils Relative: 1 %
Eosinophils Absolute: 0.3 10*3/uL (ref 0.0–0.5)
Eosinophils Relative: 3 %
HCT: 44.5 % (ref 39.0–52.0)
Hemoglobin: 14.7 g/dL (ref 13.0–17.0)
Immature Granulocytes: 0 %
Lymphocytes Relative: 25 %
Lymphs Abs: 2.7 10*3/uL (ref 0.7–4.0)
MCH: 30.6 pg (ref 26.0–34.0)
MCHC: 33 g/dL (ref 30.0–36.0)
MCV: 92.5 fL (ref 80.0–100.0)
Monocytes Absolute: 0.6 10*3/uL (ref 0.1–1.0)
Monocytes Relative: 6 %
Neutro Abs: 7 10*3/uL (ref 1.7–7.7)
Neutrophils Relative %: 65 %
Platelets: 226 10*3/uL (ref 150–400)
RBC: 4.81 MIL/uL (ref 4.22–5.81)
RDW: 12.9 % (ref 11.5–15.5)
WBC: 10.7 10*3/uL — ABNORMAL HIGH (ref 4.0–10.5)
nRBC: 0 % (ref 0.0–0.2)

## 2021-11-16 LAB — BRAIN NATRIURETIC PEPTIDE: B Natriuretic Peptide: 11.8 pg/mL (ref 0.0–100.0)

## 2021-11-16 LAB — TROPONIN I (HIGH SENSITIVITY)
Troponin I (High Sensitivity): 6 ng/L (ref <18)
Troponin I (High Sensitivity): 7 ng/L (ref <18)

## 2021-11-16 LAB — BASIC METABOLIC PANEL
Anion gap: 8 (ref 5–15)
BUN: 10 mg/dL (ref 6–20)
CO2: 29 mmol/L (ref 22–32)
Calcium: 9 mg/dL (ref 8.9–10.3)
Chloride: 100 mmol/L (ref 98–111)
Creatinine, Ser: 1.13 mg/dL (ref 0.61–1.24)
GFR, Estimated: >60 mL/min (ref >60)
Glucose, Bld: 218 mg/dL — ABNORMAL HIGH (ref 70–99)
Potassium: 3.8 mmol/L (ref 3.5–5.1)
Sodium: 137 mmol/L (ref 135–145)

## 2021-11-16 MED ORDER — IPRATROPIUM-ALBUTEROL 0.5-2.5 (3) MG/3ML IN SOLN
3.0000 mL | Freq: Once | RESPIRATORY_TRACT | Status: AC
  Administered 2021-11-16: 3 mL via RESPIRATORY_TRACT
  Filled 2021-11-16: qty 3

## 2021-11-16 NOTE — ED Provider Notes (Signed)
[facility] EMERGENCY DEPARTMENT Provider Note  CSN: [id number] Arrival date & time: 11/15/21 2316  Chief Complaint(s) Shortness of Breath  HPI Jared Hudson is a 46 y.o. male with h/o asthma  SOB started 40 min after eating around 10p this evening Intermittently feels like it's hard to inhale and exhale.  Worse with exertion. No chest pain. Felt tingling in left arm and right shoulder. No recent fever or infection, coughing or new congestion Did not use inhaler, didn't have them with him.  The history is provided by the patient.   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 Authorizing Provider  amLODipine (NORVASC) 5 MG tablet Take 1 tablet (5 mg total) by mouth daily. 10/12/21 10/12/22  Melene Plan, DO  cetirizine (ZYRTEC) 10 MG tablet Take 1 tablet (10 mg total) by mouth daily. 04/07/21   Derwood Kaplan, MD  ciclesonide (ALVESCO) 160 MCG/ACT inhaler Inhale 2 puffs into the lungs 2 (two) times daily. 11/26/20   Alfonse Spruce, MD  diclofenac Sodium (VOLTAREN) 1 % GEL Apply 4 g topically 4 (four) times daily. 10/12/21   Melene Plan, DO  fluticasone (FLONASE) 50 MCG/ACT nasal spray 2 sprays into each nostril daily. 03/08/20 03/08/21  [provider]  fluticasone (FLOVENT HFA) 110 MCG/ACT inhaler Inhale 2 puffs into the lungs in the morning and at bedtime. Rinse, gargle and spit out after use. 12/12/20   Alfonse Spruce, MD  hydrochlorothiazide  (HYDRODIURIL) 25 MG tablet Take by mouth. 12/30/19 12/29/20  [provider]  ibuprofen (ADVIL) 600 MG tablet TAKE 1 TABLET BY MOUTH EVERY 6 HOURS FOR 3 DAYS 11/01/20   [provider]  metFORMIN (GLUCOPHAGE) 500 MG tablet Take by mouth. 03/18/19 11/26/20  [provider]  omeprazole (PRILOSEC) 40 MG capsule Take 1 capsule (40 mg total) by mouth daily. 03/10/20   Molpus, John, MD  ondansetron (ZOFRAN ODT) 4 MG disintegrating tablet 4mg  ODT q4 hours prn nausea/vomit 12/25/20   12/27/20, DO  ondansetron (ZOFRAN) 4 MG tablet Take 1 tablet (4 mg total) by mouth every 6 (six) hours. 09/03/21   Curatolo, Adam, DO  triamcinolone ointment (KENALOG) 0.1 % Apply 1 application topically 2 (two) times daily. 11/27/20   11/29/20, MD  pantoprazole (PROTONIX) 40 MG tablet Take 1 tablet (40 mg total) by mouth daily. 11/10/17 03/10/20  Ward, 03/12/20, DO  Allergies Eggs or egg-derived products and Other  Review of Systems Review of Systems As noted in HPI  Physical Exam Vital Signs  I have reviewed the triage vital signs BP (!) 145/117    Pulse 81    Temp 97.9 F (36.6 C) (Oral)    Resp 15    SpO2 91%   Physical Exam Vitals reviewed.  Constitutional:      General: He is not in acute distress.    Appearance: He is well-developed. He is not diaphoretic.  HENT:     Head: Normocephalic and atraumatic.     Nose: Nose normal.  Eyes:     General: No scleral icterus.       Right eye: No discharge.        Left eye: No discharge.     Conjunctiva/sclera: Conjunctivae normal.     Pupils: Pupils are equal, round, and reactive to light.  Cardiovascular:     Rate and Rhythm: Normal rate and regular rhythm.     Heart sounds: No murmur heard.   No friction rub. No gallop.  Pulmonary:     Effort: Pulmonary effort is normal. No respiratory distress.      Breath sounds: Normal breath sounds. No stridor. No rales.  Abdominal:     General: There is no distension.     Palpations: Abdomen is soft.     Tenderness: There is no abdominal tenderness.  Musculoskeletal:        General: No tenderness.     Cervical back: Normal range of motion and neck supple.     Right lower leg: 1+ Pitting Edema present.     Left lower leg: 1+ Pitting Edema present.  Skin:    General: Skin is warm and dry.     Findings: No erythema or rash.  Neurological:     Mental Status: He is alert and oriented to person, place, and time.    ED Results and Treatments Labs (all labs ordered are listed, but only abnormal results are displayed) Labs Reviewed  CBC WITH DIFFERENTIAL/PLATELET - Abnormal; Notable for the following components:      Result Value   WBC 10.7 (*)    All other components within normal limits  BASIC METABOLIC PANEL - Abnormal; Notable for the following components:   Glucose, Bld 218 (*)    All other components within normal limits  BRAIN NATRIURETIC PEPTIDE  TROPONIN I (HIGH SENSITIVITY)  TROPONIN I (HIGH SENSITIVITY)                                                                                                                         EKG  EKG Interpretation  Date/Time:  Friday November 15 2021 23:17:07 EST Ventricular Rate:  103 PR Interval:  156 QRS Duration: 96 QT Interval:  356 QTC Calculation: 466 R Axis:   35 Text Interpretation: Sinus tachycardia Otherwise normal ECG When compared with ECG of 04-Oct-2021 20:29, PREVIOUS ECG IS PRESENT Confirmed by Drema Pry (  [id number]) on 11/16/2021 1:46:27 AM       Radiology DG Chest 2 View  Result Date: 11/16/2021 CLINICAL DATA:  Shortness of breath beginning tonight. EXAM: CHEST - 2 VIEW COMPARISON:  07/14/2021 FINDINGS: The heart size and mediastinal contours are within normal limits. Both lungs are clear. The visualized skeletal structures are unremarkable. IMPRESSION: No active  cardiopulmonary disease. Electronically Signed   By: Burman NievesWilliam  Stevens M.D.   On: 11/16/2021 00:32    Pertinent labs & imaging results that were available during my care of the patient were reviewed by me and considered in my medical decision making (see MDM for details).  Medications Ordered in ED Medications  aspirin chewable tablet 324 mg (324 mg Oral Given 11/16/21 0003)  ipratropium-albuterol (DUONEB) 0.5-2.5 (3) MG/3ML nebulizer solution 3 mL (3 mLs Nebulization Given 11/16/21 0217)                                                                                                                                     Procedures Procedures  (including critical care time)  Medical Decision Making / ED Course        Patient here with intermittent shortness of breath Appears most consistent with likely bronchospasm/mild asthma exacerbation. Given comorbidities including obesity, hypertension, hyperlipidemia and diabetes cardiac work-up obtain. We will also obtain chest x-ray to rule out pneumonia.   Work-up ordered to assess concerns above.  Labs and imaging independently interpreted by me and noted below: EKG without acute ischemic changes or evidence of pericarditis.  No dysrhythmias or blocks. Serial troponins negative x2 BNP negative and chest x-ray without evidence of pulmonary edema ruling out heart failure Chest x-ray without evidence of pneumonia or pneumothorax CBC without anemia No significant electrolyte derangements or renal sufficiency  Management: DuoNeb  Reassessment: Complete resolution without recurrence     Final Clinical Impression(s) / ED Diagnoses Final diagnoses:  Shortness of breath  Mild intermittent asthma with exacerbation   The patient appears reasonably screened and/or stabilized for discharge and I doubt any other medical condition or other [facility] requiring further screening, evaluation, or treatment in the ED at this time prior to discharge. Safe for  discharge with strict return precautions.  Disposition: Discharge  Condition: Good  I have discussed the results, Dx and Tx plan with the patient/family who expressed understanding and agree(s) with the plan. Discharge instructions discussed at length. The patient/family was given strict return precautions who verbalized understanding of the instructions. No further questions at time of discharge.    ED Discharge Orders     None        Follow Up: Lorelee MarketYao, Marguerite M, MD [address] [facility] [place] [id number] [phone]  Call  as needed, if symptoms do not improve or  worsen           This chart was dictated using voice recognition software.  Despite  best efforts to proofread,  errors can occur which can change the documentation meaning.    Nira Connardama, Meliton Samad Eduardo, MD 11/16/21 [phone]

## 2021-11-16 NOTE — ED Notes (Signed)
Pt discharged and ambulated out of the ED without difficulty. 

## 2022-03-29 IMAGING — DX DG CHEST 1V PORT
1 series · 1 of 1 positions shown · non-contrast
Comparison: 07/05/2019

CLINICAL DATA: Cough.

EXAM:
PORTABLE CHEST 1 VIEW

[chest]
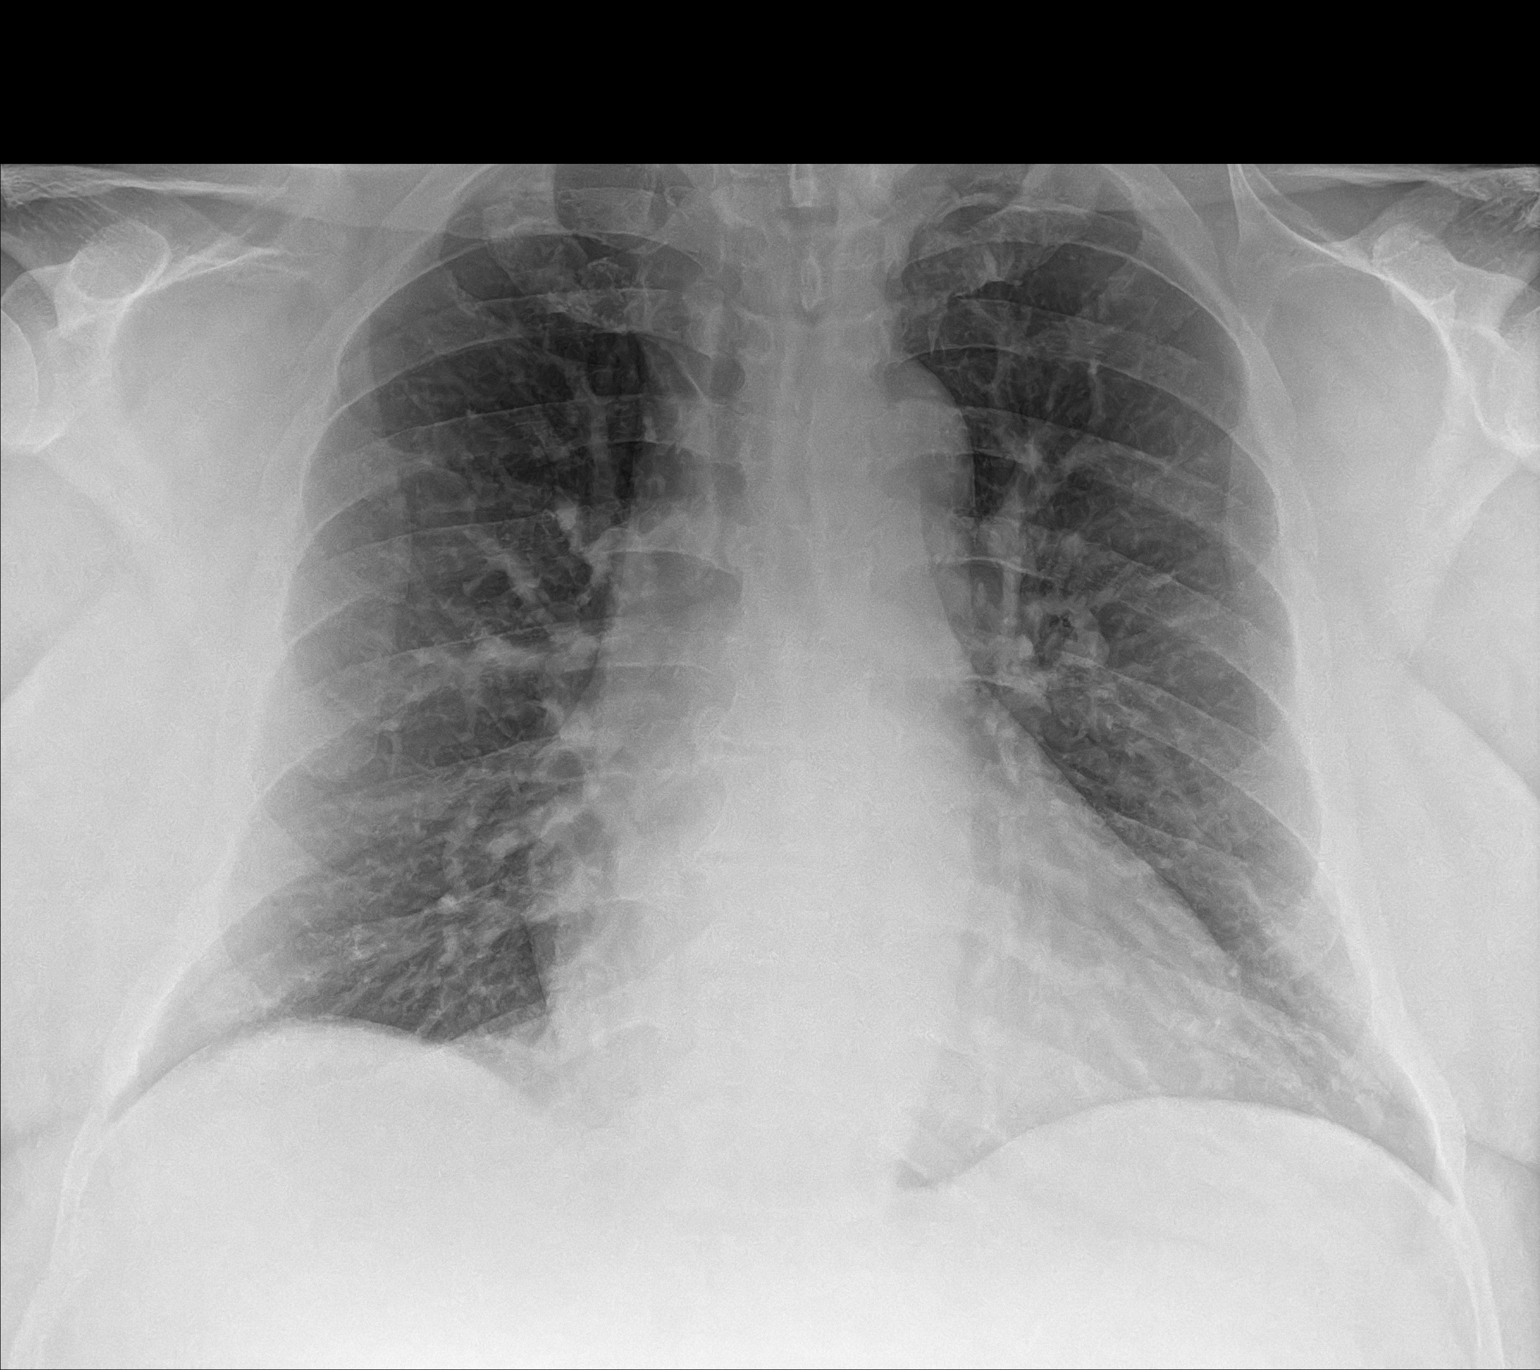

[1 of 1 positions shown; findings below may reference images not displayed]

FINDINGS: The heart size and mediastinal contours are within normal limits.
Both lungs are clear. The visualized skeletal structures are
unremarkable.
IMPRESSION: No active disease.

## 2022-05-29 ENCOUNTER — Ambulatory Visit (HOSPITAL_COMMUNITY)
Admission: EM | Admit: 2022-05-29 | Discharge: 2022-05-29 | Disposition: A | Discharge status: Home / Self Care | Payer: Commercial Managed Care - HMO | Attending: Family Medicine | Admitting: Family Medicine

## 2022-05-29 ENCOUNTER — Encounter (HOSPITAL_COMMUNITY): Payer: Self-pay

## 2022-05-29 DIAGNOSIS — M79652 Pain in left thigh: Secondary | ICD-10-CM | POA: Diagnosis not present

## 2022-05-29 DIAGNOSIS — M7989 Other specified soft tissue disorders: Secondary | ICD-10-CM

## 2022-05-29 DIAGNOSIS — M79605 Pain in left leg: Secondary | ICD-10-CM

## 2022-05-29 NOTE — Discharge Instructions (Signed)
You were seen today for left thigh pain.  I have ordered an ultrasound, and someone will be in contact to get this set up.  Hopefully this will be scheduled for tomorrow AM.  In the mean time, if you have worsening pain, redness, warmth or swelling, then I recommend you go to the ER for further evaluation.  You may take tylenol for pain.

## 2022-05-29 NOTE — ED Provider Notes (Signed)
[facility]    CSN: [id number] Arrival date & time: 05/29/22  1131      History   Chief Complaint Chief Complaint  Patient presents with   Leg Pain    HPI Jared Hudson is a 46 y.o. male.   This morning he started with pain at the left lateral thigh.  Starts just above the knee to the mid/upper thigh.  Feels like needles at the side of the leg, and feels like a bandana around the upper leg.  He also noted a rope like vein at the thigh that is visible.  He just woke up with this today.   No pain below the knee.  No known swelling He did not do any heavy lifting or anything strenuous yesterday.  Feels better if he walks, more pain with just sitting.  He does have h/o neuropathy.    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   Seasonal and perennial allergic rhinitis 11/27/2020   Mild persistent asthma, uncomplicated 11/27/2020   Flexural atopic dermatitis 11/27/2020    Past Surgical History:  Procedure Laterality Date   ADENOIDECTOMY     TONSILLECTOMY         Home Medications    Prior to Admission medications   Medication Sig Start Date End Date Taking? Authorizing Provider  amLODipine (NORVASC) 5 MG tablet Take 1 tablet (5 mg total) by mouth daily. 10/12/21 10/12/22  Melene Plan, DO  cetirizine (ZYRTEC) 10 MG tablet Take 1 tablet (10 mg total) by mouth daily. 04/07/21   Derwood Kaplan, MD  ciclesonide (ALVESCO) 160 MCG/ACT inhaler Inhale 2 puffs into the lungs 2 (two) times daily. 11/26/20   Alfonse Spruce, MD  diclofenac Sodium (VOLTAREN) 1 % GEL Apply 4 g topically 4 (four) times daily. 10/12/21   Melene Plan, DO  fluticasone (FLONASE) 50 MCG/ACT nasal spray 2 sprays into each nostril daily. 03/08/20 03/08/21   [provider]  fluticasone (FLOVENT HFA) 110 MCG/ACT inhaler Inhale 2 puffs into the lungs in the morning and at bedtime. Rinse, gargle and spit out after use. 12/12/20   Alfonse Spruce, MD  hydrochlorothiazide (HYDRODIURIL) 25 MG tablet Take by mouth. 12/30/19 12/29/20  [provider]  ibuprofen (ADVIL) 600 MG tablet TAKE 1 TABLET BY MOUTH EVERY 6 HOURS FOR 3 DAYS 11/01/20   [provider]  metFORMIN (GLUCOPHAGE) 500 MG tablet Take by mouth. 03/18/19 11/26/20  [provider]  omeprazole (PRILOSEC) 40 MG capsule Take 1 capsule (40 mg total) by mouth daily. 03/10/20   Molpus, John, MD  ondansetron (ZOFRAN ODT) 4 MG disintegrating tablet 4mg  ODT q4 hours prn nausea/vomit 12/25/20   12/27/20, DO  ondansetron (ZOFRAN) 4 MG tablet Take 1 tablet (4 mg total) by mouth every 6 (six) hours. 09/03/21   Curatolo, Adam, DO  triamcinolone ointment (KENALOG) 0.1 % Apply 1 application topically 2 (two) times daily. 11/27/20   11/29/20, MD  pantoprazole (PROTONIX) 40 MG tablet Take 1 tablet (40 mg total) by mouth daily. 11/10/17 03/10/20  Ward, 03/12/20, DO    Family History Family History  Problem Relation Age of Onset   Asthma Paternal Grandfather    Allergic rhinitis  Neg Hx    Angioedema Neg Hx    Eczema Neg Hx    Immunodeficiency Neg Hx    Urticaria Neg Hx     Social History Social History   Tobacco Use   Smoking status: Never    Passive exposure: Past   Smokeless tobacco: Never  Vaping Use   Vaping Use: Never used  Substance Use Topics   Alcohol use: No   Drug use: No     Allergies   Eggs or egg-derived products and Other   Review of Systems Review of Systems  Constitutional: Negative.   HENT: Negative.    Respiratory: Negative.    Cardiovascular: Negative.   Gastrointestinal: Negative.   Genitourinary: Negative.   Skin: Negative.   Neurological: Negative.   Psychiatric/Behavioral: Negative.       Physical Exam Triage  Vital Signs ED Triage Vitals  Enc Vitals Group     BP 05/29/22 1156 (!) 152/89     Pulse Rate 05/29/22 1156 92     Resp 05/29/22 1156 16     Temp 05/29/22 1156 98.3 F (36.8 C)     Temp Source 05/29/22 1156 Oral     SpO2 05/29/22 1156 99 %     Weight --      Height --      Head Circumference --      Peak Flow --      Pain Score 05/29/22 1157 10     Pain Loc --      Pain Edu? --      Excl. in GC? --    No data found.  Updated Vital Signs BP (!) 152/89 (BP Location: Left Arm)   Pulse 92   Temp 98.3 F (36.8 C) (Oral)   Resp 16   SpO2 99%   Visual Acuity Right Eye Distance:   Left Eye Distance:   Bilateral Distance:    Right Eye Near:   Left Eye Near:    Bilateral Near:     Physical Exam Cardiovascular:     Rate and Rhythm: Normal rate and regular rhythm.  Pulmonary:     Effort: Pulmonary effort is normal.     Breath sounds: Normal breath sounds.  Musculoskeletal:     Comments: TTP to the left lateral thigh from just above the knee to mid thigh;  no redness/warmth/swelling noted;   He does have a large, tender vein at the anterior lower left thigh;  no redness or warmth at this site either;  Full rom of the knee and hip without pain or limitation.   Skin:    General: Skin is warm and dry.     Findings: No bruising or lesion.  Neurological:     General: No focal deficit present.     Mental Status: He is alert.  Psychiatric:        Mood and Affect: Mood normal.      UC Treatments / Results  Labs (all labs ordered are listed, but only abnormal results are displayed) Labs Reviewed - No data to display  EKG   Radiology No results found.  Procedures Procedures (including critical care time)  Medications Ordered in UC Medications - No data to display  Initial Impression / Assessment and Plan / UC Course  I have reviewed the triage vital signs and the nursing notes.  Pertinent labs & imaging results that were available during my care of the patient  were reviewed by me and considered in my medical decision making (  see chart for details).  Patient was seen today for thigh pain.  He does have point TTP, as well as a new, large tender vein at the thigh.  Does not appear to be due to recent activity or strain.  Will start with an u/s of the leg at this time.  I do recommend he f/u with his pcp for further care/discussion, esp if u/s is normal.  If he has worsening symptoms then he should go to the ER for evaluation.    Final Clinical Impressions(s) / UC Diagnoses   Final diagnoses:  Left thigh pain     Discharge Instructions      You were seen today for left thigh pain.  I have ordered an ultrasound, and someone will be in contact to get this set up.  Hopefully this will be scheduled for tomorrow AM.  In the mean time, if you have worsening pain, redness, warmth or swelling, then I recommend you go to the ER for further evaluation.  You may take tylenol for pain.     ED Prescriptions   None    PDMP not reviewed this encounter.   Jannifer Franklin, MD 05/29/22 1241

## 2022-05-29 NOTE — ED Triage Notes (Signed)
Pt states left thigh pain since this am. Denies any injury.  Has not taken anything at home for the pain.

## 2022-05-30 ENCOUNTER — Ambulatory Visit (HOSPITAL_COMMUNITY)
Admission: RE | Admit: 2022-05-30 | Discharge: 2022-05-30 | Disposition: A | Discharge status: Home / Self Care | Payer: Commercial Managed Care - HMO | Source: Ambulatory Visit | Attending: Family Medicine | Admitting: Family Medicine

## 2022-05-30 DIAGNOSIS — M7989 Other specified soft tissue disorders: Secondary | ICD-10-CM

## 2022-05-30 DIAGNOSIS — M79605 Pain in left leg: Secondary | ICD-10-CM | POA: Diagnosis not present

## 2022-05-30 NOTE — Progress Notes (Signed)
Left LE venous duplex study completed. Please see CV Proc for preliminary results.  Magdalene River BS, RVT 05/30/2022 4:38 PM

## 2023-01-14 ENCOUNTER — Other Ambulatory Visit: Payer: Self-pay

## 2023-01-14 ENCOUNTER — Encounter (HOSPITAL_BASED_OUTPATIENT_CLINIC_OR_DEPARTMENT_OTHER): Payer: Self-pay

## 2023-01-14 ENCOUNTER — Emergency Department (HOSPITAL_BASED_OUTPATIENT_CLINIC_OR_DEPARTMENT_OTHER)
Admission: EM | Admit: 2023-01-14 | Discharge: 2023-01-14 | Disposition: A | Discharge status: Home / Self Care | Payer: Medicaid Other | Attending: Emergency Medicine | Admitting: Emergency Medicine

## 2023-01-14 DIAGNOSIS — Z7984 Long term (current) use of oral hypoglycemic drugs: Secondary | ICD-10-CM | POA: Insufficient documentation

## 2023-01-14 DIAGNOSIS — R600 Localized edema: Secondary | ICD-10-CM | POA: Insufficient documentation

## 2023-01-14 DIAGNOSIS — E119 Type 2 diabetes mellitus without complications: Secondary | ICD-10-CM

## 2023-01-14 DIAGNOSIS — J45909 Unspecified asthma, uncomplicated: Secondary | ICD-10-CM | POA: Insufficient documentation

## 2023-01-14 DIAGNOSIS — I10 Essential (primary) hypertension: Secondary | ICD-10-CM | POA: Insufficient documentation

## 2023-01-14 DIAGNOSIS — E1165 Type 2 diabetes mellitus with hyperglycemia: Secondary | ICD-10-CM | POA: Diagnosis not present

## 2023-01-14 DIAGNOSIS — Z7951 Long term (current) use of inhaled steroids: Secondary | ICD-10-CM | POA: Insufficient documentation

## 2023-01-14 DIAGNOSIS — R631 Polydipsia: Secondary | ICD-10-CM | POA: Diagnosis present

## 2023-01-14 DIAGNOSIS — Z79899 Other long term (current) drug therapy: Secondary | ICD-10-CM | POA: Insufficient documentation

## 2023-01-14 LAB — CBG MONITORING, ED: Glucose-Capillary: 256 mg/dL — ABNORMAL HIGH (ref 70–99)

## 2023-01-14 LAB — CBC
HCT: 47.4 % (ref 39.0–52.0)
Hemoglobin: 15.7 g/dL (ref 13.0–17.0)
MCH: 30.4 pg (ref 26.0–34.0)
MCHC: 33.1 g/dL (ref 30.0–36.0)
MCV: 91.9 fL (ref 80.0–100.0)
Platelets: 226 10*3/uL (ref 150–400)
RBC: 5.16 MIL/uL (ref 4.22–5.81)
RDW: 12.1 % (ref 11.5–15.5)
WBC: 12.4 10*3/uL — ABNORMAL HIGH (ref 4.0–10.5)
nRBC: 0 % (ref 0.0–0.2)

## 2023-01-14 LAB — BASIC METABOLIC PANEL
Anion gap: 7 (ref 5–15)
BUN: 11 mg/dL (ref 6–20)
CO2: 31 mmol/L (ref 22–32)
Calcium: 9 mg/dL (ref 8.9–10.3)
Chloride: 101 mmol/L (ref 98–111)
Creatinine, Ser: 1.04 mg/dL (ref 0.61–1.24)
GFR, Estimated: >60 mL/min (ref >60)
Glucose, Bld: 289 mg/dL — ABNORMAL HIGH (ref 70–99)
Potassium: 3.7 mmol/L (ref 3.5–5.1)
Sodium: 139 mmol/L (ref 135–145)

## 2023-01-14 LAB — URINALYSIS, ROUTINE W REFLEX MICROSCOPIC
Bilirubin Urine: NEGATIVE
Glucose, UA: 250 mg/dL — AB
Hgb urine dipstick: NEGATIVE
Ketones, ur: NEGATIVE mg/dL
Leukocytes,Ua: NEGATIVE
Nitrite: NEGATIVE
Protein, ur: NEGATIVE mg/dL
Specific Gravity, Urine: 1.017 (ref 1.005–1.030)
pH: 7.5 (ref 5.0–8.0)

## 2023-01-14 MED ORDER — METFORMIN HCL 500 MG PO TABS: 1000.0000 mg | ORAL_TABLET | Freq: Two times a day (BID) | ORAL | 0 refills | Status: AC

## 2023-01-14 NOTE — Discharge Instructions (Signed)
Your sugar was elevated here.  Sounds as if you are rather poorly controlled.  Hemoglobin A1c was sent but has not resulted yet.  For now increase the metformin to 1000 mg twice a day.  Follow-up with Dr. Darl Householder

## 2023-01-14 NOTE — ED Provider Notes (Signed)
[facility] Provider Note   CSN: [id number] Arrival date & time: 01/14/23  1713     History  Chief Complaint  Patient presents with   Polydipsia    Jared Hudson is a 47 y.o. male.  HPI Patient presents with sweet taste in mouth and urinary frequency.  Polydipsia.  Patient states he has history of prediabetes but has not been told he is diabetic.  He is however on metformin.  Sees Dr. Darl Householder.  I reviewed notes and apparently he is actually type 2 diabetes and not prediabetes.  Sounds as if he has been poorly controlled for a while now.  Does not urinate during the night but is able to urinate a lot in the morning.  States he is thirsty all the time.  Does have some edema on his legs which is not unusual for him.  Past Medical History:  Diagnosis Date   Asthma    Diabetes    GERD (gastroesophageal reflux disease)    Hypertension    Hypertension    Sleep apnea     Home Medications Prior to Admission medications   Medication Sig Start Date End Date Taking? Authorizing Provider  amLODipine (NORVASC) 5 MG tablet Take 1 tablet (5 mg total) by mouth daily. 10/12/21 10/12/22  Deno Etienne, DO  cetirizine (ZYRTEC) 10 MG tablet Take 1 tablet (10 mg total) by mouth daily. 04/07/21   Varney Biles, MD  ciclesonide (ALVESCO) 160 MCG/ACT inhaler Inhale 2 puffs into the lungs 2 (two) times daily. 11/26/20   Valentina Shaggy, MD  diclofenac Sodium (VOLTAREN) 1 % GEL Apply 4 g topically 4 (four) times daily. 10/12/21   Deno Etienne, DO  fluticasone (FLONASE) 50 MCG/ACT nasal spray 2 sprays into each nostril daily. 03/08/20 03/08/21  [provider]  fluticasone (FLOVENT HFA) 110 MCG/ACT inhaler Inhale 2 puffs into the lungs in the morning and at bedtime. Rinse, gargle and spit out after use. 12/12/20   Valentina Shaggy, MD  hydrochlorothiazide (HYDRODIURIL) 25 MG tablet Take by mouth. 12/30/19 12/29/20  [provider]  ibuprofen  (ADVIL) 600 MG tablet TAKE 1 TABLET BY MOUTH EVERY 6 HOURS FOR 3 DAYS 11/01/20   [provider]  metFORMIN (GLUCOPHAGE) 500 MG tablet Take 2 tablets (1,000 mg total) by mouth 2 (two) times daily with a meal. 01/14/23 09/24/24  Davonna Belling, MD  omeprazole (PRILOSEC) 40 MG capsule Take 1 capsule (40 mg total) by mouth daily. 03/10/20   Molpus, John, MD  ondansetron (ZOFRAN ODT) 4 MG disintegrating tablet 4mg  ODT q4 hours prn nausea/vomit 12/25/20   Deno Etienne, DO  ondansetron (ZOFRAN) 4 MG tablet Take 1 tablet (4 mg total) by mouth every 6 (six) hours. 09/03/21   Curatolo, Adam, DO  triamcinolone ointment (KENALOG) 0.1 % Apply 1 application topically 2 (two) times daily. 11/27/20   Valentina Shaggy, MD  pantoprazole (PROTONIX) 40 MG tablet Take 1 tablet (40 mg total) by mouth daily. 11/10/17 03/10/20  Ward, Delice Bison, DO      Allergies    Egg-derived products and Other    Review of Systems   Review of Systems  Physical Exam Updated Vital Signs BP (!) 161/103 (BP Location: Right Arm)   Pulse (!) 102   Temp 98.3 F (36.8 C) (Oral)   Resp 18   Ht 6\' 3"  (1.905 m)   Wt (!) 154.2 kg   SpO2 98%   BMI 42.49 kg/m  Physical Exam Vitals  and nursing note reviewed.  Cardiovascular:     Rate and Rhythm: Regular rhythm.  Pulmonary:     Breath sounds: No wheezing.  Abdominal:     Tenderness: There is no abdominal tenderness.  Musculoskeletal:     Cervical back: Neck supple.     Right lower leg: Edema present.     Left lower leg: Edema present.  Neurological:     Mental Status: He is alert.     ED Results / Procedures / Treatments   Labs (all labs ordered are listed, but only abnormal results are displayed) Labs Reviewed  BASIC METABOLIC PANEL - Abnormal; Notable for the following components:      Result Value   Glucose, Bld 289 (*)    All other components within normal limits  CBC - Abnormal; Notable for the following components:   WBC 12.4 (*)    All other components  within normal limits  URINALYSIS, ROUTINE W REFLEX MICROSCOPIC - Abnormal; Notable for the following components:   Glucose, UA 250 (*)    All other components within normal limits  CBG MONITORING, ED - Abnormal; Notable for the following components:   Glucose-Capillary 256 (*)    All other components within normal limits  HEMOGLOBIN A1C    EKG None  Radiology No results found.  Procedures Procedures    Medications Ordered in ED Medications - No data to display  ED Course/ Medical Decision Making/ A&P                             Medical Decision Making Amount and/or Complexity of Data Reviewed Labs: ordered.  Risk Prescription drug management.   Patient with hyperglycemia.  Sugar high.  Patient states not diabetic but reviewing notes from PCP he has been previously diagnosed with diabetes.  He stated he is only prediabetic.  However he appears to be an error.  Sugar is high but not in DKA.  Will increase metformin.  Has outpatient follow-up in the next couple weeks with his PCP.  Have added a hemoglobin A1c since it appears to have been a year or 2 since it has been done.  Appears stable for discharge home however.  Does not have a UTI and sugars only mildly elevated.        Final Clinical Impression(s) / ED Diagnoses Final diagnoses:  Type 2 diabetes mellitus without complication, without long-term current use of insulin    Rx / DC Orders ED Discharge Orders          Ordered    metFORMIN (GLUCOPHAGE) 500 MG tablet  2 times daily with meals        01/14/23 Donia Ast, MD 01/14/23 [phone]

## 2023-01-14 NOTE — ED Triage Notes (Signed)
Patient here POV from Home.  Endorses Sweet Taste in Mouth for 2 Weeks and began to have [employer] since [place]. No Pain. Some Frequency noted as well as Polydipsia.   History of T2DM.  NAD Noted during Triage. A&Ox4. GCS 15. Ambulatory.

## 2023-01-15 LAB — HEMOGLOBIN A1C
Hgb A1c MFr Bld: 9.8 % — ABNORMAL HIGH (ref 4.8–5.6)
Mean Plasma Glucose: 235 mg/dL

## 2023-02-27 ENCOUNTER — Encounter (HOSPITAL_BASED_OUTPATIENT_CLINIC_OR_DEPARTMENT_OTHER): Payer: Self-pay | Admitting: Emergency Medicine

## 2023-02-27 ENCOUNTER — Emergency Department (HOSPITAL_BASED_OUTPATIENT_CLINIC_OR_DEPARTMENT_OTHER)
Admission: EM | Admit: 2023-02-27 | Discharge: 2023-02-27 | Disposition: A | Discharge status: Home / Self Care | Payer: 59 | Attending: Emergency Medicine | Admitting: Emergency Medicine

## 2023-02-27 ENCOUNTER — Other Ambulatory Visit: Payer: Self-pay

## 2023-02-27 DIAGNOSIS — J45909 Unspecified asthma, uncomplicated: Secondary | ICD-10-CM | POA: Insufficient documentation

## 2023-02-27 DIAGNOSIS — Z20822 Contact with and (suspected) exposure to covid-19: Secondary | ICD-10-CM | POA: Diagnosis not present

## 2023-02-27 DIAGNOSIS — Z79899 Other long term (current) drug therapy: Secondary | ICD-10-CM | POA: Diagnosis not present

## 2023-02-27 DIAGNOSIS — B349 Viral infection, unspecified: Secondary | ICD-10-CM | POA: Insufficient documentation

## 2023-02-27 DIAGNOSIS — I10 Essential (primary) hypertension: Secondary | ICD-10-CM | POA: Diagnosis not present

## 2023-02-27 DIAGNOSIS — E1165 Type 2 diabetes mellitus with hyperglycemia: Secondary | ICD-10-CM | POA: Insufficient documentation

## 2023-02-27 DIAGNOSIS — Z7951 Long term (current) use of inhaled steroids: Secondary | ICD-10-CM | POA: Insufficient documentation

## 2023-02-27 DIAGNOSIS — Z7984 Long term (current) use of oral hypoglycemic drugs: Secondary | ICD-10-CM | POA: Diagnosis not present

## 2023-02-27 DIAGNOSIS — R110 Nausea: Secondary | ICD-10-CM | POA: Diagnosis not present

## 2023-02-27 LAB — CBG MONITORING, ED: Glucose-Capillary: 204 mg/dL — ABNORMAL HIGH (ref 70–99)

## 2023-02-27 LAB — SARS CORONAVIRUS 2 BY RT PCR: SARS Coronavirus 2 by RT PCR: NEGATIVE

## 2023-02-27 MED ORDER — ONDANSETRON HCL 4 MG PO TABS: 4.0000 mg | ORAL_TABLET | Freq: Four times a day (QID) | ORAL | 0 refills | Status: AC

## 2023-02-27 MED ORDER — ACETAMINOPHEN 500 MG PO TABS
1000.0000 mg | ORAL_TABLET | Freq: Once | ORAL | Status: AC
Start: 2023-02-27 — End: 2023-02-27
  Administered 2023-02-27: 1000 mg via ORAL
  Filled 2023-02-27: qty 2

## 2023-02-27 MED ORDER — IBUPROFEN 400 MG PO TABS
600.0000 mg | ORAL_TABLET | Freq: Once | ORAL | Status: AC
  Administered 2023-02-27: 600 mg via ORAL
  Filled 2023-02-27: qty 1

## 2023-02-27 MED ORDER — BENZONATATE 100 MG PO CAPS: 100.0000 mg | ORAL_CAPSULE | Freq: Three times a day (TID) | ORAL | 0 refills | Status: DC

## 2023-02-27 MED ORDER — ONDANSETRON 4 MG PO TBDP
4.0000 mg | ORAL_TABLET | Freq: Once | ORAL | Status: AC
  Administered 2023-02-27: 4 mg via ORAL
  Filled 2023-02-27: qty 1

## 2023-02-27 NOTE — Discharge Instructions (Addendum)
You were seen in the emergency department for your cough, nausea and bodyaches.  Your blood sugar was only slightly above normal at 204 and you tested negative for COVID.  You likely have another viral infection and can continue symptomatic treatment with Tylenol and Motrin every 6 hours for your body aches, Zofran for nausea, I have given you a cough medicine to help with cough in addition to raw honey in hot water or tea.  You can follow-up with your primary doctor in the next few days to have your symptoms rechecked.  You should return to the emergency department if you have repetitive vomiting despite the Zofran, fevers, worsening shortness of breath or any other new or concerning symptoms.

## 2023-02-27 NOTE — ED Triage Notes (Signed)
Pt here from work with  c/o n/v and body aches , no fevers says a lot people people at work are sick

## 2023-02-27 NOTE — ED Provider Notes (Signed)
[place] EMERGENCY DEPARTMENT AT [facility] Provider Note   CSN: [id number] Arrival date & time: 02/27/23  4098     History  Chief Complaint  Patient presents with   Nausea    Jared Hudson is a 47 y.o. male.  Patient is a 47 year old male with a past medical history of diabetes, hypertension and asthma presenting to the emergency department with bodyaches and nausea.  The patient states that yesterday afternoon he started to develop cough and congestion.  He states that when he woke up this morning that he had diffuse body aches and felt nauseous.  He states that he has not had any vomiting or diarrhea but reports generalized stomach upset.  He denies any fevers.  He denies any worsening shortness of breath.  Reports that multiple coworkers have been sick with similar symptoms.  The history is provided by the patient.       Home Medications Prior to Admission medications   Medication Sig Start Date End Date Taking? Authorizing Provider  benzonatate (TESSALON) 100 MG capsule Take 1 capsule (100 mg total) by mouth every 8 (eight) hours. 02/27/23  Yes Theresia Lo, Turkey K, DO  ondansetron (ZOFRAN) 4 MG tablet Take 1 tablet (4 mg total) by mouth every 6 (six) hours. 02/27/23  Yes Theresia Lo, Turkey K, DO  amLODipine (NORVASC) 5 MG tablet Take 1 tablet (5 mg total) by mouth daily. 10/12/21 10/12/22  Melene Plan, DO  cetirizine (ZYRTEC) 10 MG tablet Take 1 tablet (10 mg total) by mouth daily. 04/07/21   Derwood Kaplan, MD  ciclesonide (ALVESCO) 160 MCG/ACT inhaler Inhale 2 puffs into the lungs 2 (two) times daily. 11/26/20   Alfonse Spruce, MD  diclofenac Sodium (VOLTAREN) 1 % GEL Apply 4 g topically 4 (four) times daily. 10/12/21   Melene Plan, DO  fluticasone (FLONASE) 50 MCG/ACT nasal spray 2 sprays into each nostril daily. 03/08/20 03/08/21  [provider]  fluticasone (FLOVENT HFA) 110 MCG/ACT inhaler Inhale 2 puffs into the lungs in the morning and at  bedtime. Rinse, gargle and spit out after use. 12/12/20   Alfonse Spruce, MD  hydrochlorothiazide (HYDRODIURIL) 25 MG tablet Take by mouth. 12/30/19 12/29/20  [provider]  ibuprofen (ADVIL) 600 MG tablet TAKE 1 TABLET BY MOUTH EVERY 6 HOURS FOR 3 DAYS 11/01/20   [provider]  metFORMIN (GLUCOPHAGE) 500 MG tablet Take 2 tablets (1,000 mg total) by mouth 2 (two) times daily with a meal. 01/14/23 09/24/24  Benjiman Core, MD  omeprazole (PRILOSEC) 40 MG capsule Take 1 capsule (40 mg total) by mouth daily. 03/10/20   Molpus, John, MD  ondansetron (ZOFRAN ODT) 4 MG disintegrating tablet 4mg  ODT q4 hours prn nausea/vomit 12/25/20   Melene Plan, DO  triamcinolone ointment (KENALOG) 0.1 % Apply 1 application topically 2 (two) times daily. 11/27/20   Alfonse Spruce, MD  pantoprazole (PROTONIX) 40 MG tablet Take 1 tablet (40 mg total) by mouth daily. 11/10/17 03/10/20  Ward, Layla Maw, DO      Allergies    Egg-derived products and Other    Review of Systems   Review of Systems  Physical Exam Updated Vital Signs BP (!) 129/94   Pulse (!) 104   Temp 98.4 F (36.9 C) (Oral)   Resp 18   Ht 6\' 3"  (1.905 m)   Wt (!) 151.5 kg   SpO2 95%   BMI 41.75 kg/m  Physical Exam Vitals and nursing note reviewed.  Constitutional:  General: He is not in acute distress.    Appearance: Normal appearance. He is obese.  HENT:     Head: Normocephalic and atraumatic.     Nose: Nose normal.     Mouth/Throat:     Mouth: Mucous membranes are moist.     Pharynx: Oropharynx is clear.  Eyes:     Extraocular Movements: Extraocular movements intact.     Conjunctiva/sclera: Conjunctivae normal.  Cardiovascular:     Rate and Rhythm: Normal rate and regular rhythm.     Heart sounds: Normal heart sounds.  Pulmonary:     Effort: Pulmonary effort is normal.     Breath sounds: Normal breath sounds.  Abdominal:     General: Abdomen is flat.     Palpations: Abdomen is soft.      Tenderness: There is no abdominal tenderness.  Musculoskeletal:        General: Normal range of motion.     Cervical back: Normal range of motion and neck supple.  Skin:    General: Skin is warm and dry.  Neurological:     General: No focal deficit present.     Mental Status: He is alert and oriented to person, place, and time.  Psychiatric:        Mood and Affect: Mood normal.        Behavior: Behavior normal.     ED Results / Procedures / Treatments   Labs (all labs ordered are listed, but only abnormal results are displayed) Labs Reviewed  CBG MONITORING, ED - Abnormal; Notable for the following components:      Result Value   Glucose-Capillary 204 (*)    All other components within normal limits  SARS CORONAVIRUS 2 BY RT PCR    EKG None  Radiology No results found.  Procedures Procedures    Medications Ordered in ED Medications  ondansetron (ZOFRAN-ODT) disintegrating tablet 4 mg (4 mg Oral Given 02/27/23 0735)  acetaminophen (TYLENOL) tablet 1,000 mg (1,000 mg Oral Given 02/27/23 0734)  ibuprofen (ADVIL) tablet 600 mg (600 mg Oral Given 02/27/23 1610)    ED Course/ Medical Decision Making/ A&P Clinical Course as of 02/27/23 0906  Fri Feb 27, 2023  0756 Glucose 204 making DKA or hyperglycemic crisis unlikely. [VK]  [id number] COVID test negative, patient has improvement of his symptoms and he is stable for discharge home with outpatient follow-up and was given strict return precautions. [VK]    Clinical Course User Index [VK] Rexford Maus, DO                             Medical Decision Making This patient presents to the ED with chief complaint(s) of nausea, cough, body aches with pertinent past medical history of hypertension, diabetes, asthma which further complicates the presenting complaint. The complaint involves an extensive differential diagnosis and also carries with it a high risk of complications and morbidity.    The differential diagnosis  includes viral syndrome, pneumonia unlikely as he is satting well on room air without focal breath sounds, considering hyperglycemic crisis in the setting of his nausea and infectious symptoms, no signs of severe dehydration on exam  Additional history obtained: Additional history obtained from N/A Records reviewed Primary Care Documents  ED Course and Reassessment: On patient's arrival he is mildly tachycardic but otherwise well-appearing in no acute distress.  The patient will be tested for COVID-19 and will have a blood glucose performed to evaluate  for possible hyperglycemic crisis contributing to his nausea in the setting of his likely viral syndrome.  He was given Tylenol, Motrin and Zofran for symptomatic management and will be closely reassessed.  Independent labs interpretation:  The following labs were independently interpreted: COVID negative, mildly elevated glucose  Independent visualization of imaging: - N/A  Consultation: - Consulted or discussed management/test interpretation w/ external professional: N/A  Consideration for admission or further workup: Patient has no emergent conditions requiring admission or further work-up at this time and is stable for discharge home with primary care follow-up  Social Determinants of health: N/A    Risk OTC drugs. Prescription drug management.          Final Clinical Impression(s) / ED Diagnoses Final diagnoses:  Viral syndrome    Rx / DC Orders ED Discharge Orders          Ordered    ondansetron (ZOFRAN) 4 MG tablet  Every 6 hours        02/27/23 0904    benzonatate (TESSALON) 100 MG capsule  Every 8 hours        02/27/23 0904              Rexford Maus, DO 02/27/23 [phone]

## 2023-02-28 ENCOUNTER — Emergency Department (HOSPITAL_BASED_OUTPATIENT_CLINIC_OR_DEPARTMENT_OTHER)
Admission: EM | Admit: 2023-02-28 | Discharge: 2023-02-28 | Disposition: A | Discharge status: Home / Self Care | Payer: 59 | Attending: Emergency Medicine | Admitting: Emergency Medicine

## 2023-02-28 ENCOUNTER — Encounter (HOSPITAL_BASED_OUTPATIENT_CLINIC_OR_DEPARTMENT_OTHER): Payer: Self-pay

## 2023-02-28 ENCOUNTER — Other Ambulatory Visit: Payer: Self-pay

## 2023-02-28 DIAGNOSIS — E119 Type 2 diabetes mellitus without complications: Secondary | ICD-10-CM | POA: Diagnosis not present

## 2023-02-28 DIAGNOSIS — B349 Viral infection, unspecified: Secondary | ICD-10-CM

## 2023-02-28 DIAGNOSIS — Z7984 Long term (current) use of oral hypoglycemic drugs: Secondary | ICD-10-CM | POA: Insufficient documentation

## 2023-02-28 DIAGNOSIS — R112 Nausea with vomiting, unspecified: Secondary | ICD-10-CM | POA: Diagnosis not present

## 2023-02-28 LAB — CBC
HCT: 41.1 % (ref 39.0–52.0)
Hemoglobin: 13.3 g/dL (ref 13.0–17.0)
MCH: 30 pg (ref 26.0–34.0)
MCHC: 32.4 g/dL (ref 30.0–36.0)
MCV: 92.8 fL (ref 80.0–100.0)
Platelets: 179 10*3/uL (ref 150–400)
RBC: 4.43 MIL/uL (ref 4.22–5.81)
RDW: 13 % (ref 11.5–15.5)
WBC: 7.5 10*3/uL (ref 4.0–10.5)
nRBC: 0 % (ref 0.0–0.2)

## 2023-02-28 LAB — URINALYSIS, ROUTINE W REFLEX MICROSCOPIC
Bilirubin Urine: NEGATIVE
Glucose, UA: NEGATIVE mg/dL
Hgb urine dipstick: NEGATIVE
Ketones, ur: NEGATIVE mg/dL
Leukocytes,Ua: NEGATIVE
Nitrite: NEGATIVE
Protein, ur: NEGATIVE mg/dL
Specific Gravity, Urine: 1.023 (ref 1.005–1.030)
pH: 5.5 (ref 5.0–8.0)

## 2023-02-28 LAB — COMPREHENSIVE METABOLIC PANEL
ALT: 24 U/L (ref 0–44)
AST: 29 U/L (ref 15–41)
Albumin: 3.6 g/dL (ref 3.5–5.0)
Alkaline Phosphatase: 51 U/L (ref 38–126)
Anion gap: 6 (ref 5–15)
BUN: 17 mg/dL (ref 6–20)
CO2: 30 mmol/L (ref 22–32)
Calcium: 8.4 mg/dL — ABNORMAL LOW (ref 8.9–10.3)
Chloride: 100 mmol/L (ref 98–111)
Creatinine, Ser: 1.31 mg/dL — ABNORMAL HIGH (ref 0.61–1.24)
GFR, Estimated: >60 mL/min (ref >60)
Glucose, Bld: 139 mg/dL — ABNORMAL HIGH (ref 70–99)
Potassium: 3.5 mmol/L (ref 3.5–5.1)
Sodium: 136 mmol/L (ref 135–145)
Total Bilirubin: 0.6 mg/dL (ref 0.3–1.2)
Total Protein: 6.7 g/dL (ref 6.5–8.1)

## 2023-02-28 LAB — LIPASE, BLOOD: Lipase: 12 U/L (ref 11–51)

## 2023-02-28 LAB — CBG MONITORING, ED: Glucose-Capillary: 129 mg/dL — ABNORMAL HIGH (ref 70–99)

## 2023-02-28 MED ORDER — ONDANSETRON HCL 4 MG/2ML IJ SOLN
4.0000 mg | Freq: Once | INTRAMUSCULAR | Status: AC
  Administered 2023-02-28: 4 mg via INTRAVENOUS
  Filled 2023-02-28: qty 2

## 2023-02-28 MED ORDER — KETOROLAC TROMETHAMINE 15 MG/ML IJ SOLN
15.0000 mg | Freq: Once | INTRAMUSCULAR | Status: AC
  Administered 2023-02-28: 15 mg via INTRAVENOUS
  Filled 2023-02-28: qty 1

## 2023-02-28 MED ORDER — SODIUM CHLORIDE 0.9 % IV BOLUS
1000.0000 mL | Freq: Once | INTRAVENOUS | Status: AC
Start: 2023-02-28 — End: 2023-02-28
  Administered 2023-02-28: 1000 mL via INTRAVENOUS

## 2023-02-28 NOTE — ED Notes (Signed)
Dc instructions reviewed with patient. Patient voiced understanding. Dc with belongings.  °

## 2023-02-28 NOTE — ED Triage Notes (Signed)
Patient here POV from Home.  Endorses being in ED yesterday for Cough, N/V. Seeks Re-Evaluation for Possible Dehydration. No Fevers. Some Loose Stools. Cough Subsided and Body Aches subsided mostly.   NAD Noted during Triage. A&Ox4. GCS 15. BIB Wheelchair.

## 2023-02-28 NOTE — ED Provider Notes (Signed)
[place] EMERGENCY DEPARTMENT AT [facility] Provider Note   CSN: [id number] Arrival date & time: 02/28/23  1449     History  Chief Complaint  Patient presents with   Nausea    Jared Hudson is a 47 y.o. male with history of diabetes, presenting to the ED with day 3 of bodyaches, nausea, chills, vomiting.  Patient ports onset of symptoms on Thursday, 3 days ago.  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  The patient reports nausea, stomach upset, loose stools today, he had a headache on day 1 that is resolved, he reports that he feels very thirsty.  He denies excessive urination.  He denies any persistent abdominal pain.  He denies persistent vomiting.  He was able to drink Gatorade overnight.  The patient was seen in the emergency department yesterday, and had a negative rapid COVID test. BS was 204 yesterday.  He was told he has a likely viral syndrome.  HPI     Home Medications Prior to Admission medications   Medication Sig Start Date End Date Taking? Authorizing Provider  amLODipine (NORVASC) 5 MG tablet Take 1 tablet (5 mg total) by mouth daily. 10/12/21 10/12/22  Melene Plan, DO  benzonatate (TESSALON) 100 MG capsule Take 1 capsule (100 mg total) by mouth every 8 (eight) hours. 02/27/23   Rexford Maus, DO  cetirizine (ZYRTEC) 10 MG tablet Take 1 tablet (10 mg total) by mouth daily. 04/07/21   Derwood Kaplan, MD  ciclesonide (ALVESCO) 160 MCG/ACT inhaler Inhale 2 puffs into the lungs 2 (two) times daily. 11/26/20   Alfonse Spruce, MD  diclofenac Sodium (VOLTAREN) 1 % GEL Apply 4 g topically 4 (four) times daily. 10/12/21   Melene Plan, DO  fluticasone (FLONASE) 50 MCG/ACT nasal spray 2 sprays into each nostril daily. 03/08/20 03/08/21  [provider]  fluticasone (FLOVENT HFA) 110 MCG/ACT inhaler Inhale 2 puffs into the lungs in the morning and at bedtime. Rinse, gargle and spit out  after use. 12/12/20   Alfonse Spruce, MD  hydrochlorothiazide (HYDRODIURIL) 25 MG tablet Take by mouth. 12/30/19 12/29/20  [provider]  ibuprofen (ADVIL) 600 MG tablet TAKE 1 TABLET BY MOUTH EVERY 6 HOURS FOR 3 DAYS 11/01/20   [provider]  metFORMIN (GLUCOPHAGE) 500 MG tablet Take 2 tablets (1,000 mg total) by mouth 2 (two) times daily with a meal. 01/14/23 09/24/24  Benjiman Core, MD  omeprazole (PRILOSEC) 40 MG capsule Take 1 capsule (40 mg total) by mouth daily. 03/10/20   Molpus, John, MD  ondansetron (ZOFRAN ODT) 4 MG disintegrating tablet 4mg  ODT q4 hours prn nausea/vomit 12/25/20   Melene Plan, DO  ondansetron (ZOFRAN) 4 MG tablet Take 1 tablet (4 mg total) by mouth every 6 (six) hours. 02/27/23   Elayne Snare K, DO  triamcinolone ointment (KENALOG) 0.1 % Apply 1 application topically 2 (two) times daily. 11/27/20   Alfonse Spruce, MD  pantoprazole (PROTONIX) 40 MG tablet Take 1 tablet (40 mg total) by mouth daily. 11/10/17 03/10/20  Ward, Layla Maw, DO      Allergies    Egg-derived products and Other    Review of Systems   Review of Systems  Physical Exam Updated Vital Signs BP 113/67   Pulse 86   Temp 99.7 F (37.6 C) (Oral)   Resp 20   Ht 6\' 3"  (1.905 m)   Wt (!) 151.5 kg  SpO2 96%   BMI 41.75 kg/m  Physical Exam Constitutional:      General: He is not in acute distress.    Appearance: He is obese.  HENT:     Head: Normocephalic and atraumatic.  Eyes:     Conjunctiva/sclera: Conjunctivae normal.     Pupils: Pupils are equal, round, and reactive to light.  Cardiovascular:     Rate and Rhythm: Normal rate and regular rhythm.  Pulmonary:     Effort: Pulmonary effort is normal. No respiratory distress.  Abdominal:     General: There is no distension.     Tenderness: There is no abdominal tenderness.     Comments: Soft periumbilical hernia that is reducible on exam  Skin:    General: Skin is warm and dry.  Neurological:      General: No focal deficit present.     Mental Status: He is alert. Mental status is at baseline.  Psychiatric:        Mood and Affect: Mood normal.        Behavior: Behavior normal.     ED Results / Procedures / Treatments   Labs (all labs ordered are listed, but only abnormal results are displayed) Labs Reviewed  COMPREHENSIVE METABOLIC PANEL - Abnormal; Notable for the following components:      Result Value   Glucose, Bld 139 (*)    Creatinine, Ser 1.31 (*)    Calcium 8.4 (*)    All other components within normal limits  CBG MONITORING, ED - Abnormal; Notable for the following components:   Glucose-Capillary 129 (*)    All other components within normal limits  LIPASE, BLOOD  CBC  URINALYSIS, ROUTINE W REFLEX MICROSCOPIC    EKG None  Radiology No results found.  Procedures Procedures    Medications Ordered in ED Medications  sodium chloride 0.9 % bolus 1,000 mL (0 mLs Intravenous Stopped 02/28/23 1722)  ondansetron (ZOFRAN) injection 4 mg (4 mg Intravenous Given 02/28/23 1612)  ketorolac (TORADOL) 15 MG/ML injection 15 mg (15 mg Intravenous Given 02/28/23 1613)    ED Course/ Medical Decision Making/ A&P Clinical Course as of 02/28/23 1856  Sat Feb 28, 2023  1727 Patient is reassessed and feeling much better after his IV fluids.  He is easily tolerating p.o.  I do not see any emergent findings on his blood work to warrant hospitalization at this time.  I suspect this is a viral illness and would recommend continued supportive care.  The patient did take a nap and had some sleep apnea which is a chronic issue, and was placed on 3 L at the time, but I was able to immediately discontinue this and observe his O2 saturation normal in the ED while awake [MT]    Clinical Course User Index [MT] Bonifacio Pruden, Kermit Balo, MD                             Medical Decision Making Amount and/or Complexity of Data Reviewed Labs: ordered.  Risk Prescription drug management.   This  patient presents to the ED with concern for myalgias, headaches, nausea, fevers. This involves an extensive number of treatment options, and is a complaint that carries with it a high risk of complications and morbidity.  The differential diagnosis includes viral illness most likely, particularly given the multiple sick contacts in his workplace, as well as multiple systems involved.  This is less likely sepsis, pneumonia, intra-abdominal infectious or  inflammatory process.  He does not have focal abdominal tenderness on exam, nor any respiratory complaints to suggest pneumonia.  I ordered and personally interpreted labs.  The pertinent results include: No leukocytosis.  No acute anemia.  Blood sugars 139.  Cr near recent baseline 1.3 (from April 2024), UA without evidence of infection.  No ketones in the urine or anion gap to raise concern for ketoacidosis.   The patient was maintained on a cardiac monitor.  I personally viewed and interpreted the cardiac monitored which showed an underlying rhythm of: NSR  I ordered medication including IV fluids, IV Zofran, IV Toradol  Test Considered: No indication for emergent imaging of the chest or the abdomen at this time.  I have a low suspicion for pneumonia or intra-abdominal surgical or emergent process.  He has a soft and reducible hernia without evidence of bowel obstruction or incarceration.   After the interventions noted above, I reevaluated the patient and found that they have: improved   Dispostion:  After consideration of the diagnostic results and the patients response to treatment, I feel that the patent would benefit from close outpatient follow-up..  I explained that he most likely suspect that this is a viral syndrome.  The patient already has Zofran prescribed from his prior visit to the ED yesterday.  I recommended continued conservative care through the weekend as well as Monday, and a work note was provided.  I expect him to be feeling  better early next week.  I did advise PCP follow-up, or return to the ED if he has more emergent concerns.         Final Clinical Impression(s) / ED Diagnoses Final diagnoses:  Viral illness    Rx / DC Orders ED Discharge Orders     None         Earvin Blazier, Kermit Balo, MD 02/28/23 [phone]

## 2023-06-04 ENCOUNTER — Telehealth: Payer: Self-pay

## 2023-06-04 NOTE — Telephone Encounter (Signed)
Error

## 2023-07-19 ENCOUNTER — Other Ambulatory Visit: Payer: Self-pay

## 2023-07-19 ENCOUNTER — Emergency Department (HOSPITAL_BASED_OUTPATIENT_CLINIC_OR_DEPARTMENT_OTHER)
Admission: EM | Admit: 2023-07-19 | Discharge: 2023-07-20 | Disposition: A | Discharge status: Home / Self Care | Payer: Medicaid Other | Attending: Emergency Medicine | Admitting: Emergency Medicine

## 2023-07-19 ENCOUNTER — Emergency Department (HOSPITAL_BASED_OUTPATIENT_CLINIC_OR_DEPARTMENT_OTHER): Payer: Medicaid Other | Admitting: Radiology

## 2023-07-19 ENCOUNTER — Encounter (HOSPITAL_BASED_OUTPATIENT_CLINIC_OR_DEPARTMENT_OTHER): Payer: Self-pay

## 2023-07-19 DIAGNOSIS — Z1152 Encounter for screening for COVID-19: Secondary | ICD-10-CM | POA: Diagnosis not present

## 2023-07-19 DIAGNOSIS — Z79899 Other long term (current) drug therapy: Secondary | ICD-10-CM | POA: Insufficient documentation

## 2023-07-19 DIAGNOSIS — R110 Nausea: Secondary | ICD-10-CM | POA: Diagnosis not present

## 2023-07-19 DIAGNOSIS — J069 Acute upper respiratory infection, unspecified: Secondary | ICD-10-CM | POA: Diagnosis not present

## 2023-07-19 DIAGNOSIS — R059 Cough, unspecified: Secondary | ICD-10-CM | POA: Diagnosis present

## 2023-07-19 DIAGNOSIS — R5381 Other malaise: Secondary | ICD-10-CM | POA: Insufficient documentation

## 2023-07-19 DIAGNOSIS — J189 Pneumonia, unspecified organism: Secondary | ICD-10-CM | POA: Diagnosis not present

## 2023-07-19 LAB — RESP PANEL BY RT-PCR (RSV, FLU A&B, COVID)  RVPGX2
Influenza A by PCR: NEGATIVE
Influenza B by PCR: NEGATIVE
Resp Syncytial Virus by PCR: NEGATIVE
SARS Coronavirus 2 by RT PCR: NEGATIVE

## 2023-07-19 MED ORDER — IPRATROPIUM-ALBUTEROL 0.5-2.5 (3) MG/3ML IN SOLN
RESPIRATORY_TRACT | Status: AC
  Filled 2023-07-19: qty 3

## 2023-07-19 MED ORDER — ALBUTEROL SULFATE (2.5 MG/3ML) 0.083% IN NEBU
INHALATION_SOLUTION | RESPIRATORY_TRACT | Status: AC
  Filled 2023-07-19: qty 3

## 2023-07-19 MED ORDER — ALBUTEROL SULFATE (2.5 MG/3ML) 0.083% IN NEBU
2.5000 mg | INHALATION_SOLUTION | Freq: Once | RESPIRATORY_TRACT | Status: AC
  Administered 2023-07-19: 2.5 mg via RESPIRATORY_TRACT

## 2023-07-19 MED ORDER — IPRATROPIUM-ALBUTEROL 0.5-2.5 (3) MG/3ML IN SOLN
3.0000 mL | Freq: Once | RESPIRATORY_TRACT | Status: AC
  Administered 2023-07-19: 3 mL via RESPIRATORY_TRACT

## 2023-07-19 NOTE — ED Triage Notes (Signed)
Pt presents with malaise, ShOB, nausea, subjective fever, and cough x 3 days.

## 2023-07-20 MED ORDER — AMOXICILLIN-POT CLAVULANATE 875-125 MG PO TABS: 1.0000 | ORAL_TABLET | Freq: Two times a day (BID) | ORAL | 0 refills | Status: AC

## 2023-07-20 MED ORDER — AMOXICILLIN-POT CLAVULANATE 875-125 MG PO TABS
1.0000 | ORAL_TABLET | Freq: Once | ORAL | Status: AC
  Administered 2023-07-20: 1 via ORAL
  Filled 2023-07-20: qty 1

## 2023-07-20 MED ORDER — PREDNISONE 20 MG PO TABS: ORAL_TABLET | ORAL | 0 refills | Status: AC

## 2023-07-20 MED ORDER — ACETAMINOPHEN 500 MG PO TABS
1000.0000 mg | ORAL_TABLET | Freq: Once | ORAL | Status: AC
  Administered 2023-07-20: 1000 mg via ORAL
  Filled 2023-07-20: qty 2

## 2023-07-20 MED ORDER — PREDNISONE 50 MG PO TABS
60.0000 mg | ORAL_TABLET | Freq: Once | ORAL | Status: AC
  Administered 2023-07-20: 60 mg via ORAL
  Filled 2023-07-20: qty 1

## 2023-07-20 NOTE — ED Notes (Signed)
Patient denies pain and is resting comfortably.  

## 2023-07-20 NOTE — ED Provider Notes (Signed)
[place] EMERGENCY DEPARTMENT AT [facility] Provider Note   CSN: [id number] Arrival date & time: 07/19/23  1958     History  Chief Complaint  Patient presents with   Cough    Jared Hudson is a 47 y.o. male.  47 year old male who presents ER today with generalized malaise, shortness of breath, nausea, productive cough and fever for going on a week.  He initially told triage 3 days but he states his symptoms really started last Tuesday morning maybe Monday night.  Have been somewhat intermittent since then improving with over-the-counter medications sometimes but never really got better.  No lower extremity swelling.  Improved sometimes with Excedrin.   Cough      Home Medications Prior to Admission medications   Medication Sig Start Date End Date Taking? Authorizing Provider  amoxicillin-clavulanate (AUGMENTIN) 875-125 MG tablet Take 1 tablet by mouth every 12 (twelve) hours. 07/20/23  Yes Alaynna Kerwood, Barbara Cower, MD  predniSONE (DELTASONE) 20 MG tablet 2 tabs po daily x 4 days 07/20/23  Yes Adil Tugwell, Barbara Cower, MD  amLODipine (NORVASC) 5 MG tablet Take 1 tablet (5 mg total) by mouth daily. 10/12/21 10/12/22  Melene Plan, DO  benzonatate (TESSALON) 100 MG capsule Take 1 capsule (100 mg total) by mouth every 8 (eight) hours. 02/27/23   Rexford Maus, DO  cetirizine (ZYRTEC) 10 MG tablet Take 1 tablet (10 mg total) by mouth daily. 04/07/21   Derwood Kaplan, MD  ciclesonide (ALVESCO) 160 MCG/ACT inhaler Inhale 2 puffs into the lungs 2 (two) times daily. 11/26/20   Alfonse Spruce, MD  diclofenac Sodium (VOLTAREN) 1 % GEL Apply 4 g topically 4 (four) times daily. 10/12/21   Melene Plan, DO  fluticasone (FLONASE) 50 MCG/ACT nasal spray 2 sprays into each nostril daily. 03/08/20 03/08/21  [provider]  fluticasone (FLOVENT HFA) 110 MCG/ACT inhaler Inhale 2 puffs into the lungs in the morning and at bedtime. Rinse, gargle and spit out after use. 12/12/20   Alfonse Spruce, MD  hydrochlorothiazide (HYDRODIURIL) 25 MG tablet Take by mouth. 12/30/19 12/29/20  [provider]  ibuprofen (ADVIL) 600 MG tablet TAKE 1 TABLET BY MOUTH EVERY 6 HOURS FOR 3 DAYS 11/01/20   [provider]  metFORMIN (GLUCOPHAGE) 500 MG tablet Take 2 tablets (1,000 mg total) by mouth 2 (two) times daily with a meal. 01/14/23 09/24/24  Benjiman Core, MD  omeprazole (PRILOSEC) 40 MG capsule Take 1 capsule (40 mg total) by mouth daily. 03/10/20   Molpus, John, MD  ondansetron (ZOFRAN ODT) 4 MG disintegrating tablet 4mg  ODT q4 hours prn nausea/vomit 12/25/20   Melene Plan, DO  ondansetron (ZOFRAN) 4 MG tablet Take 1 tablet (4 mg total) by mouth every 6 (six) hours. 02/27/23   Elayne Snare K, DO  triamcinolone ointment (KENALOG) 0.1 % Apply 1 application topically 2 (two) times daily. 11/27/20   Alfonse Spruce, MD  pantoprazole (PROTONIX) 40 MG tablet Take 1 tablet (40 mg total) by mouth daily. 11/10/17 03/10/20  Ward, Layla Maw, DO      Allergies    Egg-derived products and Other    Review of Systems   Review of Systems  Respiratory:  Positive for cough.     Physical Exam Updated Vital Signs BP 134/76   Pulse (!) 105   Temp 98.2 F (36.8 C) (Oral)   Resp 16   Ht 6\' 3"  (1.905 m)   Wt (!) 145.2 kg   SpO2 96%   BMI 40.00 kg/m  Physical Exam Vitals and nursing note reviewed.  Constitutional:      Appearance: He is well-developed.  HENT:     Head: Normocephalic and atraumatic.  Eyes:     Pupils: Pupils are equal, round, and reactive to light.  Cardiovascular:     Rate and Rhythm: Normal rate.  Pulmonary:     Effort: Pulmonary effort is normal. No respiratory distress.  Abdominal:     General: There is no distension.  Musculoskeletal:        General: Normal range of motion.     Cervical back: Normal range of motion.  Skin:    General: Skin is warm and dry.  Neurological:     General: No focal deficit present.     Mental Status: He is alert.     ED Results / Procedures / Treatments   Labs (all labs ordered are listed, but only abnormal results are displayed) Labs Reviewed  RESP PANEL BY RT-PCR (RSV, FLU A&B, COVID)  [id number]    EKG None  Radiology DG Chest 2 View  Result Date: 07/19/2023 CLINICAL DATA:  Evaluate for pneumonia. EXAM: CHEST - 2 VIEW COMPARISON:  Chest radiograph dated 11/16/2021. FINDINGS: Mild cardiomegaly with mild central vascular congestion. No focal consolidation, pleural effusion, pneumothorax. No acute osseous pathology. IMPRESSION: Mild cardiomegaly with mild central vascular congestion. No focal consolidation. Electronically Signed   By: Elgie Collard M.D.   On: 07/19/2023 23:53    Procedures Procedures    Medications Ordered in ED Medications  ipratropium-albuterol (DUONEB) 0.5-2.5 (3) MG/3ML nebulizer solution (  Not Given 07/20/23 0012)  albuterol (PROVENTIL) (2.5 MG/3ML) 0.083% nebulizer solution (  Not Given 07/20/23 0012)  ipratropium-albuterol (DUONEB) 0.5-2.5 (3) MG/3ML nebulizer solution 3 mL (3 mLs Nebulization Given 07/19/23 2355)  albuterol (PROVENTIL) (2.5 MG/3ML) 0.083% nebulizer solution 2.5 mg (2.5 mg Nebulization Given 07/19/23 2355)  amoxicillin-clavulanate (AUGMENTIN) 875-125 MG per tablet 1 tablet (1 tablet Oral Given 07/20/23 0158)  acetaminophen (TYLENOL) tablet 1,000 mg (1,000 mg Oral Given 07/20/23 0158)  predniSONE (DELTASONE) tablet 60 mg (60 mg Oral Given 07/20/23 0158)    ED Course/ Medical Decision Making/ A&P                                 Medical Decision Making Amount and/or Complexity of Data Reviewed Radiology: ordered.  Risk OTC drugs. Prescription drug management.   Suspect community acquired pneumonia.  Still very well could be viral however with the week of symptoms fevers or productive cough we will go ahead and treat with antibiotics, steroids. Rest, hydrate and fu w/ pcp if not improving in 3 days. No work until Thursday.      Final Clinical  Impression(s) / ED Diagnoses Final diagnoses:  Upper respiratory tract infection, unspecified type  Community acquired pneumonia, unspecified laterality    Rx / DC Orders ED Discharge Orders          Ordered    amoxicillin-clavulanate (AUGMENTIN) 875-125 MG tablet  Every 12 hours        07/20/23 0147    predniSONE (DELTASONE) 20 MG tablet        07/20/23 0147    Ambulatory referral to Gastroenterology        07/20/23 0151              Metha Kolasa, Barbara Cower, MD 07/20/23 [phone]

## 2023-08-11 ENCOUNTER — Ambulatory Visit: Payer: Medicaid Other

## 2023-08-11 ENCOUNTER — Other Ambulatory Visit: Payer: Self-pay

## 2023-08-11 ENCOUNTER — Ambulatory Visit
Admission: RE | Admit: 2023-08-11 | Discharge: 2023-08-11 | Disposition: A | Discharge status: Home / Self Care | Payer: Medicaid Other | Source: Ambulatory Visit | Attending: Internal Medicine | Admitting: Internal Medicine

## 2023-08-11 VITALS — BP 158/87 | HR 95 | Temp 98.1°F | Resp 18

## 2023-08-11 DIAGNOSIS — R059 Cough, unspecified: Secondary | ICD-10-CM

## 2023-08-11 DIAGNOSIS — R053 Chronic cough: Secondary | ICD-10-CM

## 2023-08-11 MED ORDER — BENZONATATE 100 MG PO CAPS
100.0000 mg | ORAL_CAPSULE | Freq: Three times a day (TID) | ORAL | 0 refills | Status: AC | PRN
Start: 2023-08-11 — End: ?

## 2023-08-11 NOTE — Discharge Instructions (Signed)
We will call if x-ray results are abnormal.  I have prescribed you cough medication.  Follow-up with primary care doctor as well.

## 2023-08-11 NOTE — ED Provider Notes (Signed)
[facility]    CSN: [id number] Arrival date & time: 08/11/23  1859      History   Chief Complaint Chief Complaint  Patient presents with   Cough    Fever, body gets hot and cold and nose running a lot - Entered by patient    HPI Jared Hudson is a 47 y.o. male.   Patient presents with coughing that has been present for multiple weeks.  Patient was significantly October and prescribed amoxicillin and prednisone with minimal improvement in cough.  Patient reports that cough is not worsening but has been simply consistent.  It is a productive cough.  He denies any fever.  Denies formal diagnosis of COPD but does report that he has asthma.  Has been using his albuterol inhaler as needed.   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 Authorizing Provider  benzonatate (TESSALON) 100 MG capsule Take 1 capsule (100 mg total) by mouth every 8 (eight) hours as needed for cough. 08/11/23  Yes Zerline Melchior, Rolly Salter E, FNP  amLODipine (NORVASC) 5 MG tablet Take 1 tablet (5 mg total) by mouth daily. 10/12/21 10/12/22  Melene Plan, DO  amoxicillin-clavulanate (AUGMENTIN) 875-125 MG tablet Take 1 tablet by mouth every 12 (twelve) hours. 07/20/23   Mesner, Barbara Cower, MD  cetirizine (ZYRTEC) 10 MG tablet Take 1 tablet (10 mg total) by mouth daily. 04/07/21   Derwood Kaplan, MD  ciclesonide (ALVESCO) 160 MCG/ACT  inhaler Inhale 2 puffs into the lungs 2 (two) times daily. 11/26/20   Alfonse Spruce, MD  diclofenac Sodium (VOLTAREN) 1 % GEL Apply 4 g topically 4 (four) times daily. 10/12/21   Melene Plan, DO  fluticasone (FLONASE) 50 MCG/ACT nasal spray 2 sprays into each nostril daily. 03/08/20 03/08/21  [provider]  fluticasone (FLOVENT HFA) 110 MCG/ACT inhaler Inhale 2 puffs into the lungs in the morning and at bedtime. Rinse, gargle and spit out after use. 12/12/20   Alfonse Spruce, MD  hydrochlorothiazide (HYDRODIURIL) 25 MG tablet Take by mouth. 12/30/19 12/29/20  [provider]  ibuprofen (ADVIL) 600 MG tablet TAKE 1 TABLET BY MOUTH EVERY 6 HOURS FOR 3 DAYS 11/01/20   [provider]  metFORMIN (GLUCOPHAGE) 500 MG tablet Take 2 tablets (1,000 mg total) by mouth 2 (two) times daily with a meal. 01/14/23 09/24/24  Benjiman Core, MD  omeprazole (PRILOSEC) 40 MG capsule Take 1 capsule (40 mg total) by mouth daily. 03/10/20   Molpus, John, MD  ondansetron (ZOFRAN ODT) 4 MG disintegrating tablet 4mg  ODT q4 hours prn nausea/vomit 12/25/20   Melene Plan, DO  ondansetron (ZOFRAN) 4 MG tablet Take 1 tablet (4 mg total) by mouth every 6 (six) hours. 02/27/23   Elayne Snare K, DO  predniSONE (DELTASONE) 20 MG tablet 2 tabs po daily x 4 days  Patient not taking: Reported on 08/11/2023 07/20/23   Mesner, Barbara Cower, MD  triamcinolone ointment (KENALOG) 0.1 % Apply 1 application topically 2 (two) times daily. 11/27/20   Alfonse Spruce, MD  pantoprazole (PROTONIX) 40 MG tablet Take 1 tablet (40 mg total) by mouth daily. 11/10/17 03/10/20  Ward, Layla Maw, DO    Family History Family History  Problem Relation Age of Onset   Asthma Paternal Grandfather    Allergic rhinitis Neg Hx    Angioedema Neg Hx    Eczema Neg Hx    Immunodeficiency Neg Hx    Urticaria Neg Hx     Social History Social History   Tobacco Use   Smoking status: Never    Passive exposure: Past    Smokeless tobacco: Never  Vaping Use   Vaping status: Never Used  Substance Use Topics   Alcohol use: No   Drug use: No     Allergies   Egg-derived products and Other   Review of Systems Review of Systems Per HPI  Physical Exam Triage Vital Signs ED Triage Vitals  Encounter Vitals Group     BP 08/11/23 1949 (!) 158/87     Systolic BP Percentile --      Diastolic BP Percentile --      Pulse Rate 08/11/23 1949 95     Resp 08/11/23 1949 18     Temp 08/11/23 1949 98.1 F (36.7 C)     Temp Source 08/11/23 1949 Oral     SpO2 08/11/23 1949 95 %     Weight --      Height --      Head Circumference --      Peak Flow --      Pain Score 08/11/23 1950 0     Pain Loc --      Pain Education --      Exclude from Growth Chart --    No data found.  Updated Vital Signs BP (!) 158/87 (BP Location: Left Arm)   Pulse 95   Temp 98.1 F (36.7 C) (Oral)   Resp 18   SpO2 95%   Visual Acuity Right Eye Distance:   Left Eye Distance:   Bilateral Distance:    Right Eye Near:   Left Eye Near:    Bilateral Near:     Physical Exam Constitutional:      General: He is not in acute distress.    Appearance: Normal appearance. He is not toxic-appearing or diaphoretic.  HENT:     Head: Normocephalic and atraumatic.  Eyes:     Extraocular Movements: Extraocular movements intact.     Conjunctiva/sclera: Conjunctivae normal.  Cardiovascular:     Rate and Rhythm: Normal rate and regular rhythm.     Pulses: Normal pulses.     Heart sounds: Normal heart sounds.  Pulmonary:     Effort: Pulmonary effort is normal. No respiratory distress.     Breath sounds: Normal breath sounds. No stridor. No wheezing, rhonchi or rales.  Neurological:     General: No focal deficit present.     Mental Status: He is alert and oriented to person, place, and time. Mental status is at baseline.  Psychiatric:        Mood and Affect: Mood normal.        Behavior: Behavior normal.        Thought Content:  Thought content normal.        Judgment: Judgment normal.      UC  Treatments / Results  Labs (all labs ordered are listed, but only abnormal results are displayed) Labs Reviewed - No data to display  EKG   Radiology No results found.  Procedures Procedures (including critical care time)  Medications Ordered in UC Medications - No data to display  Initial Impression / Assessment and Plan / UC Course  I have reviewed the triage vital signs and the nursing notes.  Pertinent labs & imaging results that were available during my care of the patient were reviewed by me and considered in my medical decision making (see chart for details).     *** Final Clinical Impressions(s) / UC Diagnoses   Final diagnoses:  Persistent cough for 3 weeks or longer     Discharge Instructions      We will call if x-ray results are abnormal.  I have prescribed you cough medication.  Follow-up with primary care doctor as well.   ED Prescriptions     Medication Sig Dispense Auth. Provider   benzonatate (TESSALON) 100 MG capsule Take 1 capsule (100 mg total) by mouth every 8 (eight) hours as needed for cough. 21 capsule [place], Acie Fredrickson, [place]      PDMP not reviewed this encounter.

## 2023-08-11 NOTE — ED Triage Notes (Signed)
Pt sts cough x weeks; pt was seen in ed for same given meds with some improvement but sts now increased again

## 2023-08-12 ENCOUNTER — Telehealth: Payer: Self-pay

## 2023-08-12 NOTE — Telephone Encounter (Signed)
Calling patient to discuss the following per Provider Laren Everts NP):  Original note: [phone] Jared Hudson. just let him know x-ray shows bronchitis. I sent him cough med. Have him continue that and inhaler as needed and follow up with PCP if symptoms not improving.   No Answer. MyChart message sent.  B. Roten CMA
